# Patient Record
Sex: Male | Born: 1960 | Race: White | Hispanic: No | Marital: Single | Smoking: Current every day smoker
Health system: Southern US, Community
[De-identification: ages and names within clinical notes are randomized; demographics above are authoritative.]

## PROBLEM LIST (undated history)

## (undated) DIAGNOSIS — Z862 Personal history of diseases of the blood and blood-forming organs and certain disorders involving the immune mechanism: Secondary | ICD-10-CM

## (undated) DIAGNOSIS — I82409 Acute embolism and thrombosis of unspecified deep veins of unspecified lower extremity: Secondary | ICD-10-CM

## (undated) DIAGNOSIS — K259 Gastric ulcer, unspecified as acute or chronic, without hemorrhage or perforation: Secondary | ICD-10-CM

## (undated) HISTORY — PX: OTHER SURGICAL HISTORY: SHX169

---

## 2002-06-04 ENCOUNTER — Encounter: Payer: Self-pay | Admitting: Emergency Medicine

## 2002-06-04 ENCOUNTER — Emergency Department (HOSPITAL_COMMUNITY): Admission: EM | Admit: 2002-06-04 | Discharge: 2002-06-04 | Payer: Self-pay | Admitting: Emergency Medicine

## 2002-08-12 ENCOUNTER — Emergency Department (HOSPITAL_COMMUNITY): Admission: EM | Admit: 2002-08-12 | Discharge: 2002-08-12 | Payer: Self-pay | Admitting: Internal Medicine

## 2005-04-11 ENCOUNTER — Emergency Department: Payer: Self-pay | Admitting: Emergency Medicine

## 2014-07-06 ENCOUNTER — Inpatient Hospital Stay: Payer: Self-pay | Admitting: Surgery

## 2014-07-06 LAB — COMPREHENSIVE METABOLIC PANEL
Albumin: 3.2 g/dL — ABNORMAL LOW (ref 3.4–5.0)
Alkaline Phosphatase: 44 U/L — ABNORMAL LOW
Anion Gap: 13 (ref 7–16)
BUN: 11 mg/dL (ref 7–18)
Bilirubin,Total: 0.8 mg/dL (ref 0.2–1.0)
CHLORIDE: 99 mmol/L (ref 98–107)
CREATININE: 0.84 mg/dL (ref 0.60–1.30)
Calcium, Total: 8.4 mg/dL — ABNORMAL LOW (ref 8.5–10.1)
Co2: 22 mmol/L (ref 21–32)
EGFR (Non-African Amer.): >60
Glucose: 134 mg/dL — ABNORMAL HIGH (ref 65–99)
Osmolality: 270 (ref 275–301)
POTASSIUM: 4.3 mmol/L (ref 3.5–5.1)
SGOT(AST): 55 U/L — ABNORMAL HIGH (ref 15–37)
SGPT (ALT): 44 U/L
Sodium: 134 mmol/L — ABNORMAL LOW (ref 136–145)
Total Protein: 6.6 g/dL (ref 6.4–8.2)

## 2014-07-06 LAB — CBC
HCT: 45.5 % (ref 40.0–52.0)
HGB: 15.2 g/dL (ref 13.0–18.0)
MCH: 34.2 pg — AB (ref 26.0–34.0)
MCHC: 33.4 g/dL (ref 32.0–36.0)
MCV: 102 fL — ABNORMAL HIGH (ref 80–100)
PLATELETS: 158 10*3/uL (ref 150–440)
RBC: 4.45 10*6/uL (ref 4.40–5.90)
RDW: 14.1 % (ref 11.5–14.5)
WBC: 16.4 10*3/uL — ABNORMAL HIGH (ref 3.8–10.6)

## 2014-07-06 LAB — LIPASE, BLOOD: Lipase: 118 U/L (ref 73–393)

## 2014-07-07 LAB — BASIC METABOLIC PANEL
Anion Gap: 7 (ref 7–16)
Anion Gap: 2 — ABNORMAL LOW (ref 7–16)
BUN: 14 mg/dL (ref 7–18)
BUN: 12 mg/dL (ref 7–18)
CALCIUM: 7.1 mg/dL — AB (ref 8.5–10.1)
CALCIUM: 7.2 mg/dL — AB (ref 8.5–10.1)
Chloride: 104 mmol/L (ref 98–107)
Chloride: 104 mmol/L (ref 98–107)
Co2: 24 mmol/L (ref 21–32)
Co2: 29 mmol/L (ref 21–32)
Creatinine: 1.06 mg/dL (ref 0.60–1.30)
Creatinine: 1.13 mg/dL (ref 0.60–1.30)
EGFR (African American): >60
EGFR (Non-African Amer.): >60
EGFR (Non-African Amer.): >60
Glucose: 139 mg/dL — ABNORMAL HIGH (ref 65–99)
Glucose: 125 mg/dL — ABNORMAL HIGH (ref 65–99)
Osmolality: 273 (ref 275–301)
Osmolality: 271 (ref 275–301)
Potassium: 5.6 mmol/L — ABNORMAL HIGH (ref 3.5–5.1)
Potassium: 4.9 mmol/L (ref 3.5–5.1)
Sodium: 135 mmol/L — ABNORMAL LOW (ref 136–145)
Sodium: 135 mmol/L — ABNORMAL LOW (ref 136–145)

## 2014-07-07 LAB — URINALYSIS, COMPLETE
BACTERIA: NONE SEEN
Bilirubin,UR: NEGATIVE
Glucose,UR: NEGATIVE mg/dL (ref 0–75)
Ketone: NEGATIVE
LEUKOCYTE ESTERASE: NEGATIVE
Nitrite: NEGATIVE
PH: 6 (ref 4.5–8.0)
Protein: NEGATIVE
RBC,UR: 21 /HPF (ref 0–5)
SPECIFIC GRAVITY: 1.021 (ref 1.003–1.030)
Squamous Epithelial: NONE SEEN
WBC UR: 3 /HPF (ref 0–5)

## 2014-07-07 LAB — CBC WITH DIFFERENTIAL/PLATELET
BASOS PCT: 0.1 %
Basophil #: 0 10*3/uL (ref 0.0–0.1)
Eosinophil #: 0 10*3/uL (ref 0.0–0.7)
Eosinophil %: 0 %
HCT: 42.3 % (ref 40.0–52.0)
HGB: 13.9 g/dL (ref 13.0–18.0)
LYMPHS ABS: 1 10*3/uL (ref 1.0–3.6)
LYMPHS PCT: 10.2 %
MCH: 34.1 pg — ABNORMAL HIGH (ref 26.0–34.0)
MCHC: 32.8 g/dL (ref 32.0–36.0)
MCV: 104 fL — ABNORMAL HIGH (ref 80–100)
Monocyte #: 0.6 x10 3/mm (ref 0.2–1.0)
Monocyte %: 6.6 %
Neutrophil #: 8.1 10*3/uL — ABNORMAL HIGH (ref 1.4–6.5)
Neutrophil %: 83.1 %
PLATELETS: 135 10*3/uL — AB (ref 150–440)
RBC: 4.07 10*6/uL — ABNORMAL LOW (ref 4.40–5.90)
RDW: 14.2 % (ref 11.5–14.5)
WBC: 9.8 10*3/uL (ref 3.8–10.6)

## 2014-07-09 ENCOUNTER — Ambulatory Visit (HOSPITAL_COMMUNITY)
Admission: AD | Admit: 2014-07-09 | Discharge: 2014-07-09 | Disposition: A | Discharge status: Home / Self Care | Payer: Self-pay | Source: Intra-hospital | Attending: Surgical Oncology | Admitting: Surgical Oncology

## 2014-07-09 DIAGNOSIS — K631 Perforation of intestine (nontraumatic): Secondary | ICD-10-CM | POA: Insufficient documentation

## 2014-07-09 LAB — CBC WITH DIFFERENTIAL/PLATELET
Basophil #: 0 10*3/uL (ref 0.0–0.1)
Basophil %: 0.5 %
Eosinophil #: 0.1 10*3/uL (ref 0.0–0.7)
Eosinophil %: 1.2 %
HCT: 36.5 % — ABNORMAL LOW (ref 40.0–52.0)
HGB: 12.2 g/dL — ABNORMAL LOW (ref 13.0–18.0)
Lymphocyte #: 1.2 10*3/uL (ref 1.0–3.6)
Lymphocyte %: 18.7 %
MCH: 34.8 pg — ABNORMAL HIGH (ref 26.0–34.0)
MCHC: 33.6 g/dL (ref 32.0–36.0)
MCV: 104 fL — ABNORMAL HIGH (ref 80–100)
MONO ABS: 0.8 x10 3/mm (ref 0.2–1.0)
Monocyte %: 12 %
Neutrophil #: 4.2 10*3/uL (ref 1.4–6.5)
Neutrophil %: 67.6 %
PLATELETS: 107 10*3/uL — AB (ref 150–440)
RBC: 3.52 10*6/uL — ABNORMAL LOW (ref 4.40–5.90)
RDW: 14 % (ref 11.5–14.5)
WBC: 6.3 10*3/uL (ref 3.8–10.6)

## 2014-07-09 LAB — BASIC METABOLIC PANEL
Anion Gap: 5 — ABNORMAL LOW (ref 7–16)
BUN: 6 mg/dL — ABNORMAL LOW (ref 7–18)
CALCIUM: 7.7 mg/dL — AB (ref 8.5–10.1)
CO2: 30 mmol/L (ref 21–32)
CREATININE: 0.82 mg/dL (ref 0.60–1.30)
Chloride: 103 mmol/L (ref 98–107)
EGFR (African American): >60
Glucose: 98 mg/dL (ref 65–99)
Osmolality: 273 (ref 275–301)
POTASSIUM: 3.4 mmol/L — AB (ref 3.5–5.1)
Sodium: 138 mmol/L (ref 136–145)

## 2015-03-20 NOTE — H&P (Signed)
Subjective/Chief Complaint severe abdominal pain   History of Present Illness 54 y/o male with acute onset of severe unrelenting upper abdominal pain with radiation directly into his back. No preceeding abdominal pain, anorexia, weight loss, nausea or emesis Admits to heavy drinking during the last several weeks Smoke 1 ppd since age 42.   Past History chronic leg and foot pain.   Primary Physician [facility]   Past Med/Surgical Hx:  chronic pain: bilteral legs.  ITP:   none:   ALLERGIES:  No Known Allergies:    Other Allergies none   HOME MEDICATIONS: Medication Instructions Status  meloxicam 1 tab(s) orally once a day (in the morning) Active  acetaminophen-oxyCODONE 325 mg-5 mg oral tablet 1 tab(s) orally , As Needed Active   Family and Social History:  Family History Non-Contributory   Social History positive  tobacco, positive ETOH, negative Illicit drugs   + Tobacco Current (within 1 year)   Place of Living Home   Review of Systems:  Subjective/Chief Complaint see above.   Abdominal Pain Yes   Tolerating Diet No   Medications/Allergies Reviewed Medications/Allergies reviewed   Physical Exam:  GEN disheveled, critically ill appearing, 9+7.7 p 81 rr 16, BP 114/79   HEENT pale conjunctivae, PERRL   NECK supple   RESP normal resp effort  clear BS   CARD regular rate   ABD positive tenderness  signifiacnt diffuse peritonitis present   LYMPH negative neck   EXTR negative cyanosis/clubbing   NEURO cranial nerves intact   PSYCH A+O to time, place, person   Lab Results: Hepatic:  10-Aug-15 18:24   Bilirubin, Total 0.8  Alkaline Phosphatase  44 (46-116 NOTE: New Reference Range 06/16/14)  SGPT (ALT) 44 (14-63 NOTE: New Reference Range 06/16/14)  SGOT (AST)  55  Total Protein, Serum 6.6  Albumin, Serum  3.2  Routine Chem:  10-Aug-15 18:24   Lipase 118 (Result(s) reported on 06 Jul 2014 at 06:55PM.)  Glucose, Serum  134  BUN 11   Creatinine (comp) 0.84  Sodium, Serum  134  Potassium, Serum 4.3  Chloride, Serum 99  CO2, Serum 22  Calcium (Total), Serum  8.4  Osmolality (calc) 270  eGFR (African American) >60  eGFR (Non-African American) >60 (eGFR values <54m/min/1.73 m2 may be an indication of chronic kidney disease (CKD). Calculated eGFR is useful in patients with stable renal function. The eGFR calculation will not be reliable in acutely ill patients when serum creatinine is changing rapidly. It is not useful in  patients on dialysis. The eGFR calculation may not be applicable to patients at the low and high extremes of body sizes, pregnant women, and vegetarians.)  Anion Gap 13  Routine Hem:  10-Aug-15 18:24   WBC (CBC)  16.4  RBC (CBC) 4.45  Hemoglobin (CBC) 15.2  Hematocrit (CBC) 45.5  Platelet Count (CBC) 158 (Result(s) reported on 06 Jul 2014 at 06:53PM.)  MCV  102  MCH  34.2  MCHC 33.4  RDW 14.1    Assessment/Admission Diagnosis 54y/o with acute perforated DU and retro-peritoneal extravasation, large hole seen on CT corresponds to his clinical exam.   Plan NPO, IVF, IV invanz, urgent exploratory laparotomy with GPhillip Healpatch vs resection based on operative findings. Discussed serious nature of problem with patient and all his questions addressed. EKG shows NSR and portable cxr pending at this time.   Electronic Signatures: BSherri Rad(MD)  (Signed 10-Aug-15 20:11)  Authored: CHIEF COMPLAINT and HISTORY, PAST MEDICAL/SURGIAL HISTORY, ALLERGIES, Other Allergies, HOME  MEDICATIONS, FAMILY AND SOCIAL HISTORY, REVIEW OF SYSTEMS, PHYSICAL EXAM, LABS, ASSESSMENT AND PLAN   Last Updated: 10-Aug-15 20:11 by Sherri Rad (MD)

## 2015-03-20 NOTE — Discharge Summary (Signed)
PATIENT NAME:  Ricky Ray, Ricky Ray A MR#:  [id number] DATE OF BIRTH:  12/08/1960  DATE OF ADMISSION:  07/06/2014 DATE OF DISCHARGE:  07/09/2014   FINAL DIAGNOSIS: Perforated pyloric ulcer with retroperitoneal perforation.   PROCEDURE PERFORMED:  1.  CAT scan of the abdomen and pelvis.  2.  Exploratory laparotomy.  3.  Pyloroplasty.  4.  Modified Graham patch closure of pyloric ulcer.  5.  Peritoneal lavage.  6.  Placement of drains.   HOSPITAL COURSE: The patient was taken directly to the operating room, with a preoperative diagnosis of a posterior perforation of a duodenal ulcer. Intraoperative findings demonstrated this to be pyloric channel ulcer, which was repaired as described above. Postoperatively, the patient had no signs of alcohol withdrawal. He was started on a CIWA protocol. He maintained n.p.o. status with a nasogastric tube. His drains were somewhat bilious on postoperative day #1, but by  postoperative day #2, they were serosanguineous. The patient has had no signs of distress. He remained hemodynamically stable. His laboratory values remained stable. He did have a slight thrombocytopenia. Heparin was discontinued on postoperative day #2 and sequential compression devices were continued. His Foley catheter was removed on postoperative day #2.  SCDs were continued.   He is tentatively transferred to the [place] on 07/09/2014.   RECOMMENDATIONS: Will keep the NG tube for another 3 days, and JPs in place until the patient is on a regular diet, tolerating it well with no evidence of bile output. We will follow up the patient in our office as needed.   DISCHARGE MEDICATIONS:  1.  Protonix IV 40 mg b.i.d.  2.  Invanz 1 g IV q. day.  3.  Morphine PCA.  4.  Ativan.  5.  Haldol.  6.  Nitroglycerine as needed.  7.  Zofran as needed.    ____________________________ [name], MD mab:MT D: 07/09/2014 09:52:15 ET T: 07/09/2014 10:10:02 ET JOB#: [id number]  cc: Elta Guadel[place] A. Marina Gravel,  MD, <Dictator> Hortencia Conradi MD ELECTRONICALLY SIGNED 07/09/2014 19:53

## 2015-03-20 NOTE — Op Note (Signed)
PATIENT NAME:  Ricky Ray, Ricky Ray MR#:  [id number] DATE OF BIRTH:  [birth year]  DATE OF PROCEDURE:  07/06/2014  PREOPERATIVE DIAGNOSIS: Posterior perforated duodenal ulcer into retroperitoneum.  POSTOPERATIVE DIAGNOSIS: Perforated pyloric channel ulcer with gross contamination of the retroperitoneum and abdominal cavity with bile peritonitis.   FINDINGS:  The defect was 2 cm located anteriorly and slightly posteriorly   PROCEDURES PERFORMED:  1.  Exploratory laparotomy.  2.  Pyloroplasty with closure of the ulcer,  3.  Modified Graham patch application. 4.  Peritoneal lavage.   SURGEON: Sherri Rad, Ray FACS  ASSISTANT: Scrub tech.  ANESTHESIA: General endotracheal.  ESTIMATED BLOOD LOSS: 100 mL.   DRAINS: Two Jackson-Pratt's in the right upper quadrant, 1 in the retroperitoneum, 1 in Morison's pouch.   DESCRIPTION OF PROCEDURE: With informed consent, supine position and general oral endotracheal anesthesia, the patient's abdomen was widely prepped and draped with ChloraPrep solution following clipping of hair with electric clipper. Timeout was observed.   Ray midline incision was fashioned from the xiphoid to the just below the umbilicus with scalpel and electrocautery through musculofascial layers. Ray self-retaining abdominal retractor was placed. Immediately, Ray large amount of bilio-purulent material and fluid was evacuated. The abdomen was copiously irrigated with several liters of warm normal saline. Nasogastric tube was passed by anesthesia and confirmed in the antrum of the stomach by palpation and secured.   The perforation had occurred in Ray retroperitoneal fashion and this had actually dissected out the duodenum and Ray Kocher maneuver was not necessary. Ray few strands of attachments were divided with the LigaSure apparatus. The entire 2nd and 3rd portions of duodenum were inspected and I could find no evidence of Ray posterior perforation in these portions.   In the area of the pyloric channel  was Ray 2 cm round opening with no active hemorrhage. I probed this with my 5th digit, demonstrating no evidence of perforation posteriorly. Given that it was in the area of the pylorus and there was some scarring, the pylorus was opened towards the stomach for 1.5 cm with electrocautery and then Ray transverse closure was achieved in 2 layers with Ray running locking 3-0 PDS in the mucosa and multiple seromuscular 3-0 silk sutures.   Ray tongue of omentum was then liberated off the transverse colon with electrocautery and the LigaSure apparatus and then secured to the repair utilizing multiple interrupted 3-0 silk sutures.   At this point, the abdomen was again irrigated with several liters of warm normal saline. Two 19 mm Blake drains were directed into the retroperitoneal space as described above and exited the right upper quadrant, securing the exit sites with 3-0 nylon suture.   With lap and needle count correct x 2 and hemostasis being ensured on the operative field, the area over the liver was copiously irrigated and aspirated dry. The abdominal midline fascia was closed with running looped #1 PDS from the extremes of the wound with interrupted #1 Vicryls as internal retention sutures.   Skin edges were reapproximated after irrigation with skin stapler. Sterile dressing was applied. The patient was subsequently extubated and taken to the recovery room in stable and satisfactory condition by anesthesia services.     ____________________________ [name], Ray FACS  mab:TT D: 07/07/2014 20:56:34 ET T: 07/07/2014 21:21:55 ET JOB#: [id number]  cc: Ricky Ray. Marina Gravel, Ray, <Dictator> Ricky Ray Ricky Ray ELECTRONICALLY SIGNED 07/08/2014 20:21

## 2015-06-28 IMAGING — CR DG CHEST 1V PORT
1 series · 1 of 1 positions shown · non-contrast
Comparison: No priors.

CLINICAL DATA: Preoperative evaluation.

EXAM:
PORTABLE CHEST - 1 VIEW

[ap]
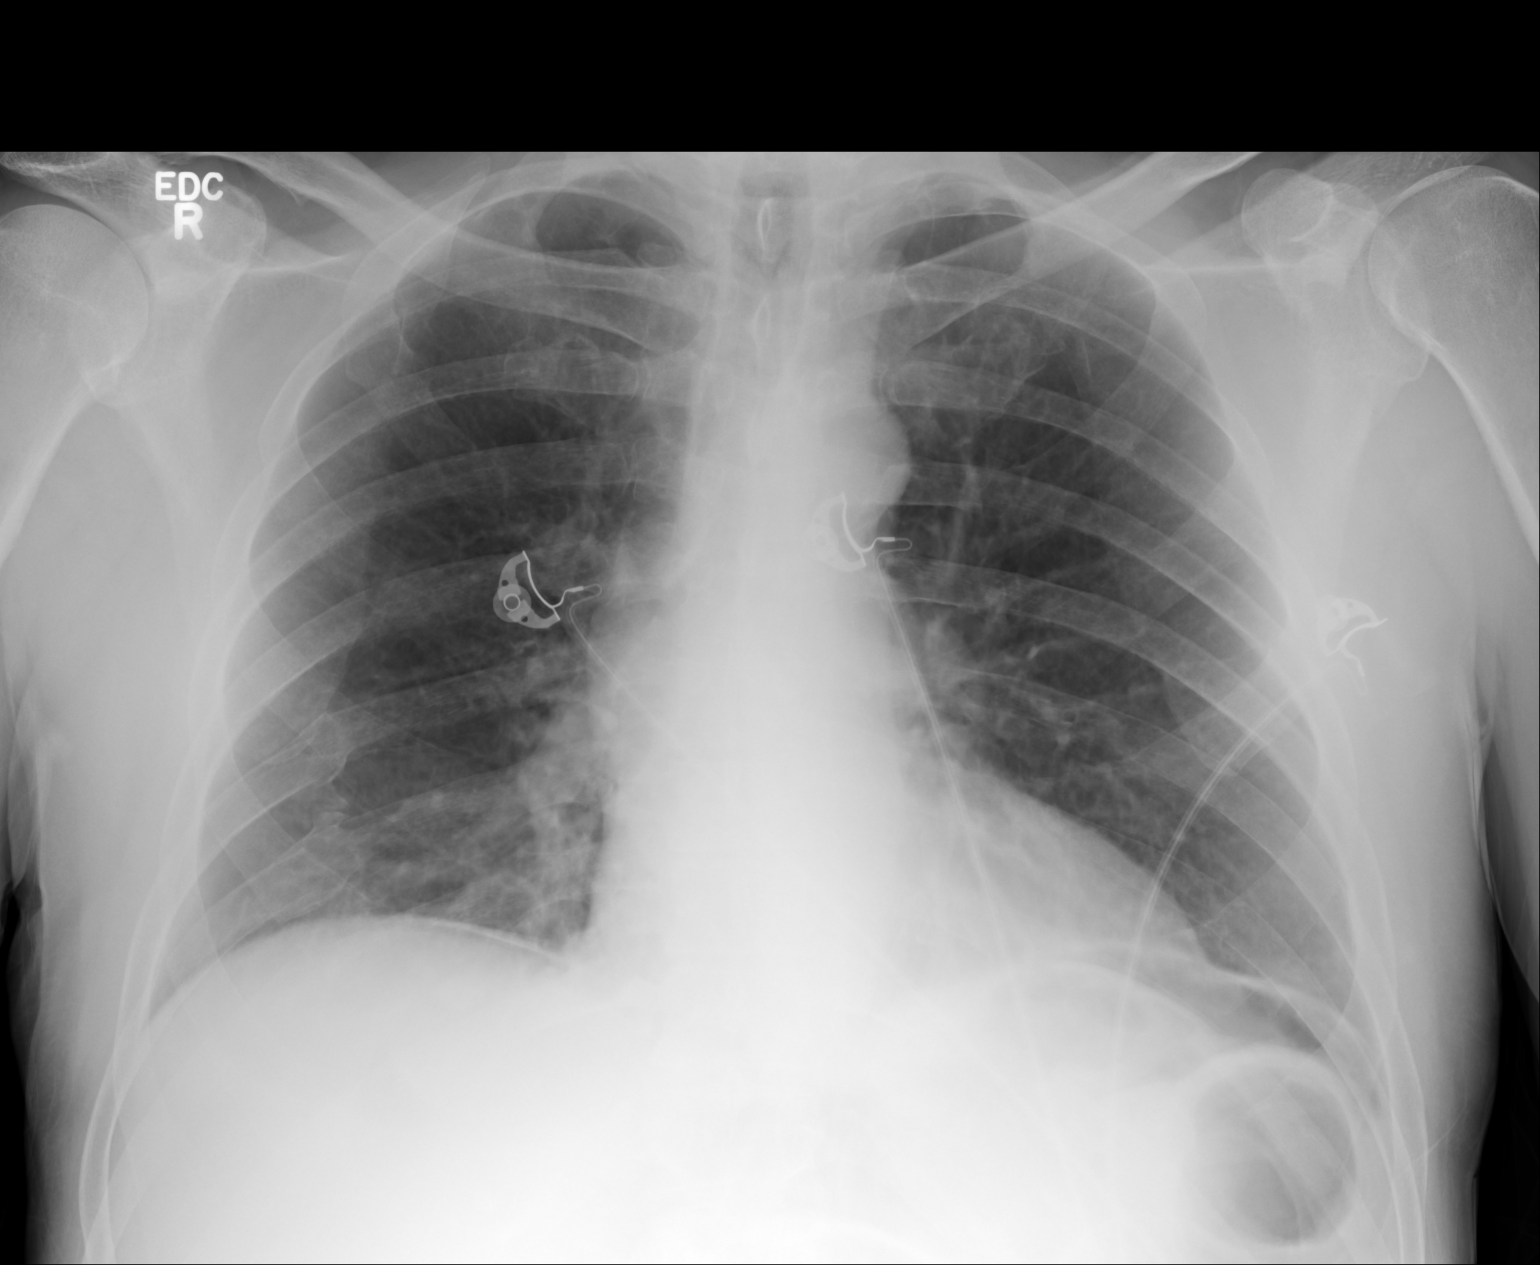

[1 of 1 positions shown; findings below may reference images not displayed]

FINDINGS: Lung volumes are low. No consolidative airspace disease. No pleural
effusions. No pneumothorax. No pulmonary nodule or mass noted.
Pulmonary vasculature and the cardiomediastinal silhouette are
within normal limits. Old healed fractures of the posterior aspect
of the right seventh and eighth ribs. Pneumoperitoneum noted
underneath both of the hemidiaphragms.
IMPRESSION: 1. Pneumoperitoneum.
2. Low lung volumes without radiographic evidence of acute
cardiopulmonary disease.

## 2015-07-14 ENCOUNTER — Emergency Department
Admission: EM | Admit: 2015-07-14 | Discharge: 2015-07-14 | Disposition: A | Discharge status: Home / Self Care | Payer: Non-veteran care | Attending: Emergency Medicine | Admitting: Emergency Medicine

## 2015-07-14 ENCOUNTER — Emergency Department: Payer: Non-veteran care

## 2015-07-14 ENCOUNTER — Encounter: Payer: Self-pay | Admitting: Emergency Medicine

## 2015-07-14 DIAGNOSIS — Z720 Tobacco use: Secondary | ICD-10-CM | POA: Insufficient documentation

## 2015-07-14 DIAGNOSIS — N453 Epididymo-orchitis: Secondary | ICD-10-CM

## 2015-07-14 DIAGNOSIS — N50819 Testicular pain, unspecified: Secondary | ICD-10-CM

## 2015-07-14 DIAGNOSIS — L03818 Cellulitis of other sites: Secondary | ICD-10-CM

## 2015-07-14 DIAGNOSIS — N454 Abscess of epididymis or testis: Secondary | ICD-10-CM | POA: Insufficient documentation

## 2015-07-14 HISTORY — DX: Gastric ulcer, unspecified as acute or chronic, without hemorrhage or perforation: K25.9

## 2015-07-14 LAB — COMPREHENSIVE METABOLIC PANEL
ALBUMIN: 3.3 g/dL — AB (ref 3.5–5.0)
ALK PHOS: 48 U/L (ref 38–126)
ALT: 35 U/L (ref 17–63)
ANION GAP: 9 (ref 5–15)
AST: 29 U/L (ref 15–41)
BILIRUBIN TOTAL: 0.8 mg/dL (ref 0.3–1.2)
CALCIUM: 8.6 mg/dL — AB (ref 8.9–10.3)
CO2: 25 mmol/L (ref 22–32)
CREATININE: 0.7 mg/dL (ref 0.61–1.24)
Chloride: 100 mmol/L — ABNORMAL LOW (ref 101–111)
GFR calc Af Amer: >60 mL/min (ref >60)
GFR calc non Af Amer: >60 mL/min (ref >60)
GLUCOSE: 118 mg/dL — AB (ref 65–99)
Potassium: 4.1 mmol/L (ref 3.5–5.1)
Sodium: 134 mmol/L — ABNORMAL LOW (ref 135–145)
TOTAL PROTEIN: 6.9 g/dL (ref 6.5–8.1)

## 2015-07-14 LAB — CBC WITH DIFFERENTIAL/PLATELET
BASOS PCT: 1 %
Basophils Absolute: 0.1 10*3/uL (ref 0–0.1)
Eosinophils Absolute: 0 10*3/uL (ref 0–0.7)
Eosinophils Relative: 0 %
HEMATOCRIT: 41.3 % (ref 40.0–52.0)
HEMOGLOBIN: 13.8 g/dL (ref 13.0–18.0)
LYMPHS ABS: 0.9 10*3/uL — AB (ref 1.0–3.6)
Lymphocytes Relative: 8 %
MCH: 35 pg — ABNORMAL HIGH (ref 26.0–34.0)
MCHC: 33.3 g/dL (ref 32.0–36.0)
MCV: 105 fL — ABNORMAL HIGH (ref 80.0–100.0)
MONOS PCT: 11 %
Monocytes Absolute: 1.2 10*3/uL — ABNORMAL HIGH (ref 0.2–1.0)
NEUTROS ABS: 9.1 10*3/uL — AB (ref 1.4–6.5)
NEUTROS PCT: 80 %
Platelets: 203 10*3/uL (ref 150–440)
RBC: 3.94 MIL/uL — AB (ref 4.40–5.90)
RDW: 14.1 % (ref 11.5–14.5)
WBC: 11.3 10*3/uL — ABNORMAL HIGH (ref 3.8–10.6)

## 2015-07-14 LAB — URINALYSIS COMPLETE WITH MICROSCOPIC (ARMC ONLY)
BILIRUBIN URINE: NEGATIVE
GLUCOSE, UA: NEGATIVE mg/dL
Ketones, ur: NEGATIVE mg/dL
Nitrite: POSITIVE — AB
Protein, ur: NEGATIVE mg/dL
Specific Gravity, Urine: 1.013 (ref 1.005–1.030)
pH: 6 (ref 5.0–8.0)

## 2015-07-14 MED ORDER — CEPHALEXIN 500 MG PO CAPS
500.0000 mg | ORAL_CAPSULE | Freq: Once | ORAL | Status: AC
  Administered 2015-07-14: 500 mg via ORAL
  Filled 2015-07-14: qty 1

## 2015-07-14 MED ORDER — OXYCODONE-ACETAMINOPHEN 5-325 MG PO TABS
ORAL_TABLET | ORAL | Status: AC
  Filled 2015-07-14: qty 1

## 2015-07-14 MED ORDER — LEVOFLOXACIN 500 MG PO TABS
500.0000 mg | ORAL_TABLET | Freq: Once | ORAL | Status: AC
  Administered 2015-07-14: 500 mg via ORAL
  Filled 2015-07-14: qty 1

## 2015-07-14 MED ORDER — TRAMADOL HCL 50 MG PO TABS: 50.0000 mg | ORAL_TABLET | Freq: Four times a day (QID) | ORAL | Status: AC | PRN

## 2015-07-14 MED ORDER — OXYCODONE-ACETAMINOPHEN 5-325 MG PO TABS
1.0000 | ORAL_TABLET | Freq: Once | ORAL | Status: AC
  Administered 2015-07-14: 1 via ORAL
  Filled 2015-07-14: qty 1

## 2015-07-14 MED ORDER — LEVOFLOXACIN 500 MG PO TABS: 500.0000 mg | ORAL_TABLET | Freq: Every day | ORAL | Status: AC

## 2015-07-14 MED ORDER — OXYCODONE-ACETAMINOPHEN 5-325 MG PO TABS
1.0000 | ORAL_TABLET | Freq: Once | ORAL | Status: AC
  Administered 2015-07-14: 1 via ORAL

## 2015-07-14 MED ORDER — CEPHALEXIN 500 MG PO CAPS: 500.0000 mg | ORAL_CAPSULE | Freq: Four times a day (QID) | ORAL | Status: AC

## 2015-07-14 NOTE — ED Provider Notes (Signed)
[facility] Emergency Department Provider Note  ____________________________________________  Time seen: Approximately 597 PM  I have reviewed the triage vital signs and the nursing notes.   HISTORY  Chief Complaint Testicle Pain    HPI Ricky Ray is a 54 y.o. male with a history of a gastric ulcer who presents today with right-sided swelling and pain in his testicle. He says that he felt a lump in that testicle. He denies any burning with urination. Denies any concern for sexually transmitted diseases. Has never had anything like this happen to him in the past. Denies any fever. Denies any abdominal pain nausea or vomiting. Denies any discharge from the penis.   Past Medical History  Diagnosis Date  . Gastric ulcer     There are no active problems to display for this patient.   History reviewed. No pertinent past surgical history.  No current outpatient prescriptions on file.  Allergies Review of patient's allergies indicates no known allergies.  History reviewed. No pertinent family history.  Social History Social History  Substance Use Topics  . Smoking status: Current Every Day Smoker  . Smokeless tobacco: None  . Alcohol Use: Yes    Review of Systems Constitutional: No fever/chills Eyes: No visual changes. ENT: No sore throat. Cardiovascular: Denies chest pain. Respiratory: Denies shortness of breath. Gastrointestinal: No abdominal pain.  No nausea, no vomiting.  No diarrhea.  No constipation. Genitourinary: Negative for dysuria. Musculoskeletal: Negative for back pain. Skin: Negative for rash. Neurological: Negative for headaches, focal weakness or numbness.  10-point ROS otherwise negative.  ____________________________________________   PHYSICAL EXAM:  VITAL SIGNS: ED Triage Vitals  Enc Vitals Group     BP 07/14/15 1714 113/83 mmHg     Pulse Rate 07/14/15 1714 89     Resp 07/14/15 1714 20     Temp 07/14/15 1714  98.9 F (37.2 C)     Temp Source 07/14/15 1714 Oral     SpO2 07/14/15 1714 98 %     Weight 07/14/15 1714 160 lb (72.576 kg)     Height 07/14/15 1714 5\' 8"  (1.727 m)     Head Cir --      Peak Flow --      Pain Score 07/14/15 1715 10     Pain Loc --      Pain Edu? --      Excl. in [place]? --     Constitutional: Alert and oriented. Disheveled and in no acute distress. Eyes: Conjunctivae are normal. PERRL. EOMI. Head: Atraumatic. Nose: No congestion/rhinnorhea. Mouth/Throat: Mucous membranes are moist.  Oropharynx non-erythematous. Neck: No stridor.   Cardiovascular: Normal rate, regular rhythm. Grossly normal heart sounds.  Good peripheral circulation. Respiratory: Normal respiratory effort.  No retractions. Lungs CTAB. Gastrointestinal: Soft and nontender. No distention. No abdominal bruits. No CVA tenderness. Genitourinary: Scrotal erythema to the right hemiscrotum with mild crossover to the left hemiscrotum. No pus or induration. Right testicle palpated and is tender diffusely. No distinct masses palpated.  No fluctuance. No hernia sacs visualized or palpated in the inguinal region. Musculoskeletal: No lower extremity tenderness nor edema.  No joint effusions. Neurologic:  Normal speech and language. No gross focal neurologic deficits are appreciated. No gait instability. Skin:  Skin is warm, dry and intact. No rash noted. Psychiatric: Mood and affect are normal. Speech and behavior are normal.  ____________________________________________   LABS (all labs ordered are listed, but only abnormal results are displayed)  Labs Reviewed  URINALYSIS COMPLETEWITH MICROSCOPIC [facility]  ONLY) - Abnormal; Notable for the following:    Color, Urine YELLOW (*)    APPearance CLEAR (*)    Hgb urine dipstick 1+ (*)    Nitrite POSITIVE (*)    Leukocytes, UA 2+ (*)    Bacteria, UA FEW (*)    Squamous Epithelial / LPF 0-5 (*)    All other components within normal limits  CBC WITH  DIFFERENTIAL/PLATELET - Abnormal; Notable for the following:    WBC 11.3 (*)    RBC 3.94 (*)    MCV 105.0 (*)    MCH 35.0 (*)    Neutro Abs 9.1 (*)    Lymphs Abs 0.9 (*)    Monocytes Absolute 1.2 (*)    All other components within normal limits  COMPREHENSIVE METABOLIC PANEL - Abnormal; Notable for the following:    Sodium 134 (*)    Chloride 100 (*)    Glucose, Bld 118 (*)    BUN <5 (*)    Calcium 8.6 (*)    Albumin 3.3 (*)    All other components within normal limits  URINE CULTURE   ____________________________________________  EKG   ____________________________________________  RADIOLOGY  Ultrasound of the scrotum with right epididymoorchitis. ____________________________________________   PROCEDURES    ____________________________________________   INITIAL IMPRESSION / ASSESSMENT AND PLAN / ED COURSE  Pertinent labs & imaging results that were available during my care of the patient were reviewed by me and considered in my medical decision making (see chart for details).  ----------------------------------------- 9:59 PM on 07/14/2015 -----------------------------------------  Patient resting comfortably at this time. Reviewed labs as well as imaging results with the patient. Says that he is sexually active, infrequently, with one partner. No history of sexually transmitted diseases. Also with nitrite positive urine. Feel that the epididymoorchitis likely sequela of a urinary tract infection. Likely Escherichia coli secondary to nitrite positive. We'll treat with Levaquin as well as Keflex to cover skin flora for the erythematous scrotum. Patient is seen at the [facility] and will be able to follow-up with his primary care doctor within 1 week. We'll discharge to home. ____________________________________________   FINAL CLINICAL IMPRESSION(S) / ED DIAGNOSES  Acute epididymoorchitis. Acute urinary tract infection. Initial visit.    Orbie Pyo,  MD 07/14/15 2200

## 2015-07-14 NOTE — ED Notes (Signed)
Reports woke up this am with painful swollen right testicle.

## 2015-07-14 NOTE — ED Notes (Signed)
States has abcess on testicle

## 2015-07-14 NOTE — ED Notes (Signed)
Patient transported to Ultrasound 

## 2015-07-16 LAB — URINE CULTURE

## 2015-07-27 ENCOUNTER — Emergency Department
Admission: EM | Admit: 2015-07-27 | Discharge: 2015-07-27 | Disposition: A | Discharge status: Home / Self Care | Payer: Non-veteran care

## 2016-09-08 IMAGING — US US SCROTUM
1 series · 14 of 25 positions shown · non-contrast
Comparison: None.

CLINICAL DATA: Redness and swelling of the right testicle for 2
days.

EXAM:
SCROTAL ULTRASOUND
DOPPLER ULTRASOUND OF THE TESTICLES
TECHNIQUE: Complete ultrasound examination of the testicles, epididymis, and
other scrotal structures was performed. Color and spectral Doppler
ultrasound were also utilized to evaluate blood flow to the
testicles.

[Series 1: us scrotum · 0.06mm/px · 14 of 83 slices shown]
[im 1/83]
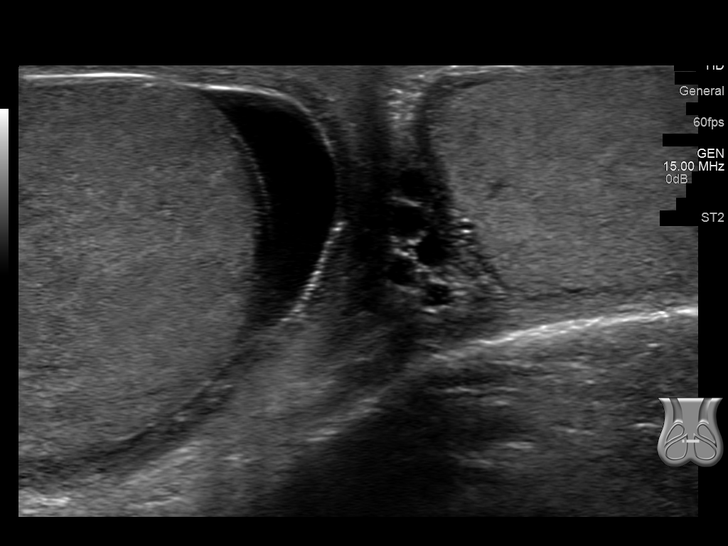
[im 7/83]
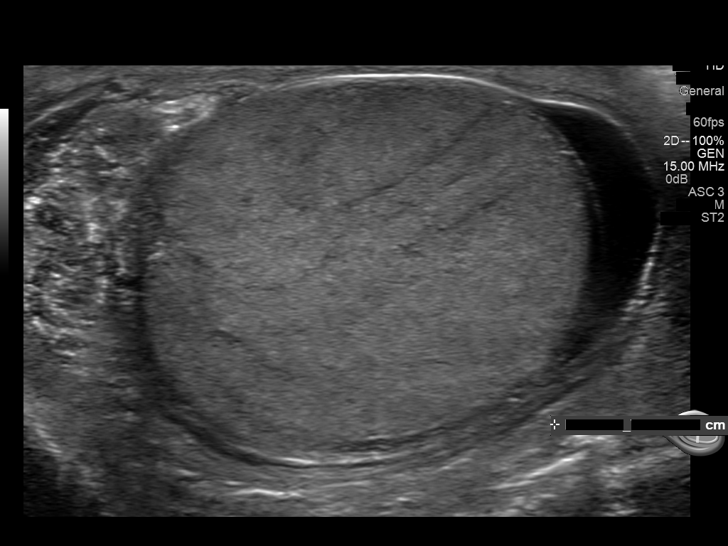
[im 14/83]
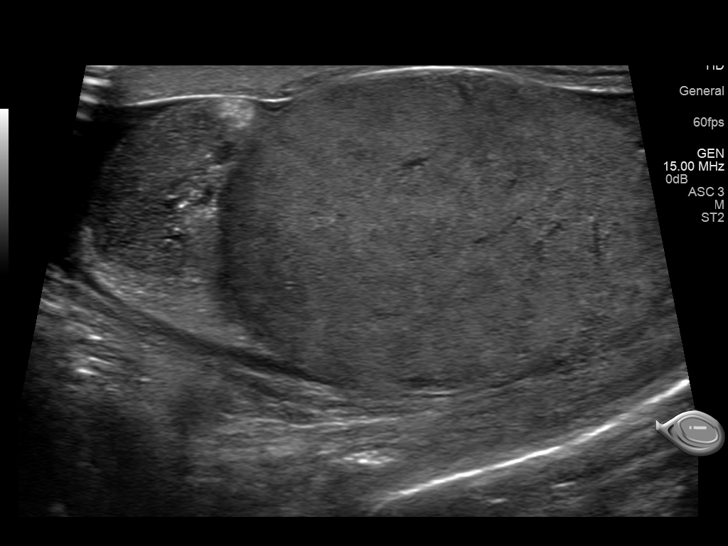
[im 21/83]
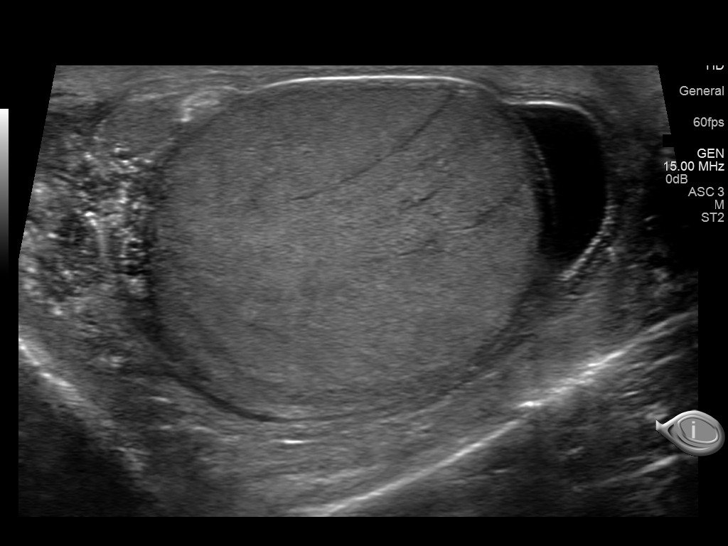
[im 28/83]
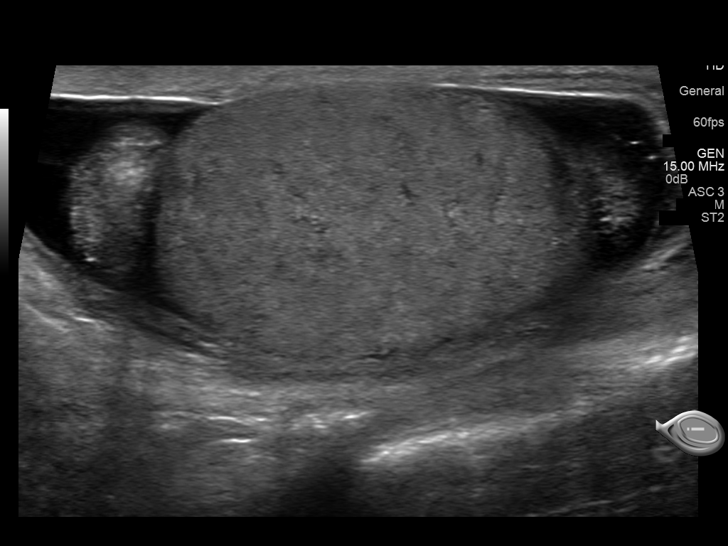
[im 31/83]
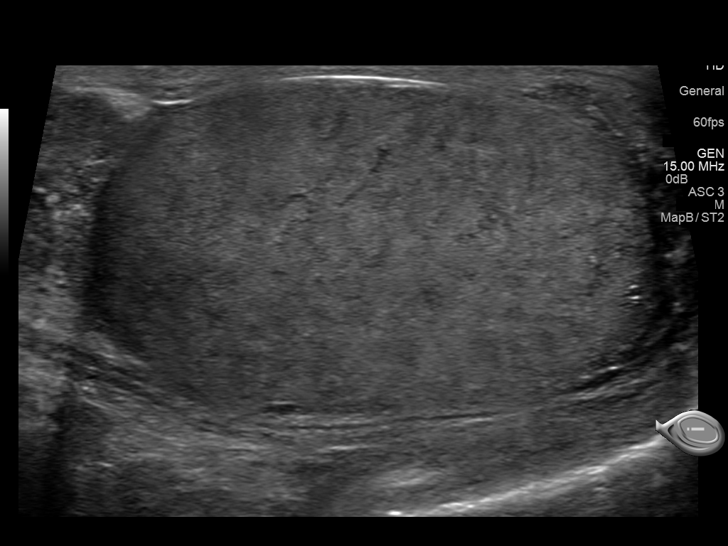
[im 38/83]
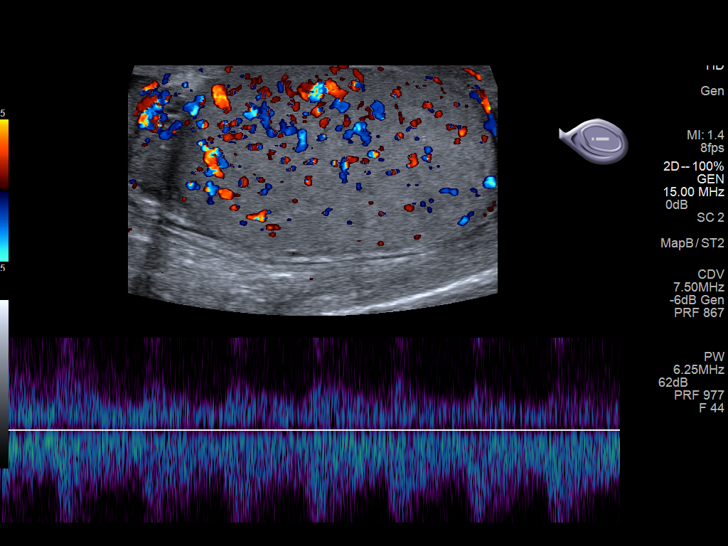
[im 45/83]
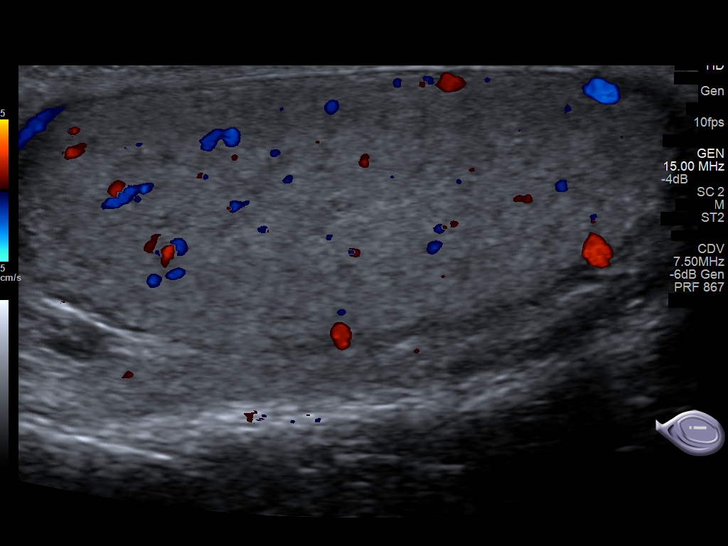
[im 52/83]
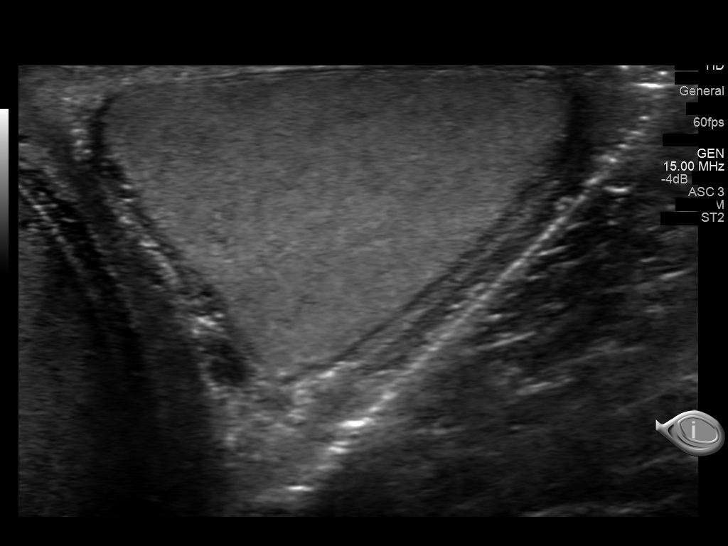
[im 55/83]
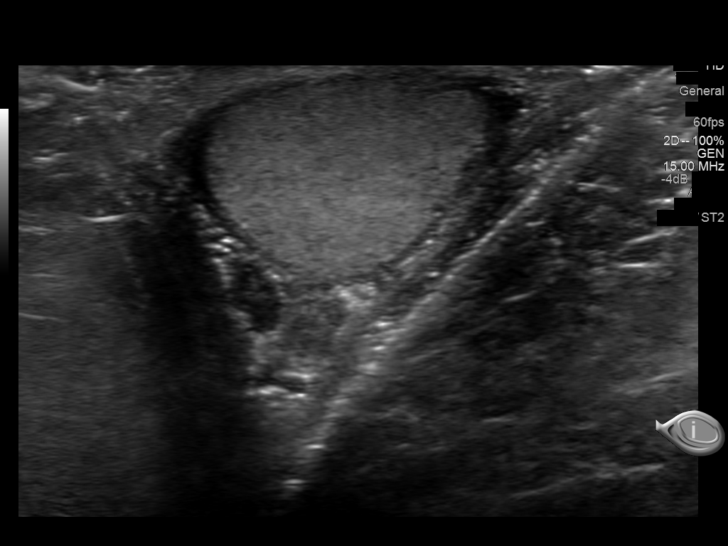
[im 62/83]
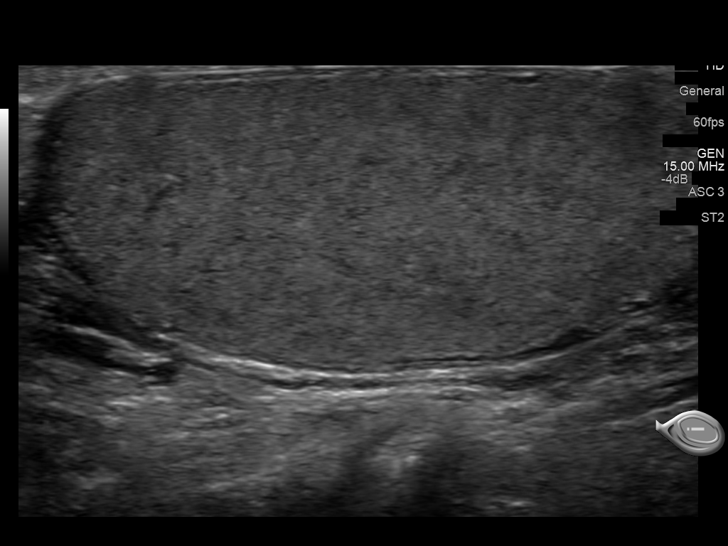
[im 69/83]
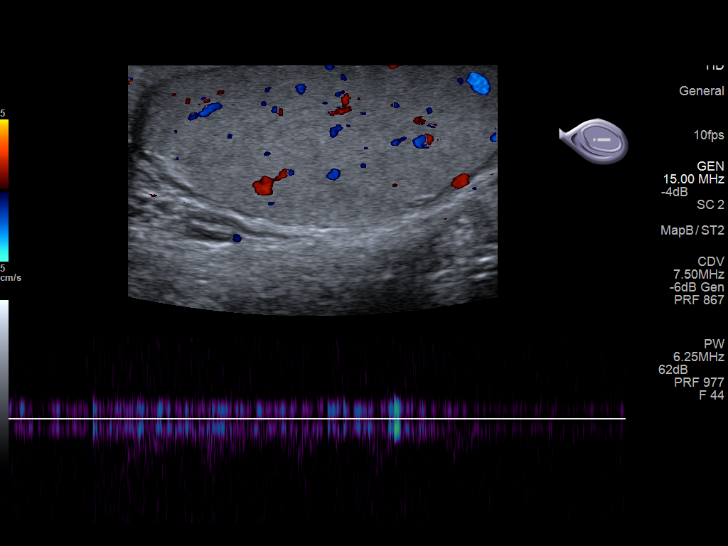
[im 76/83]
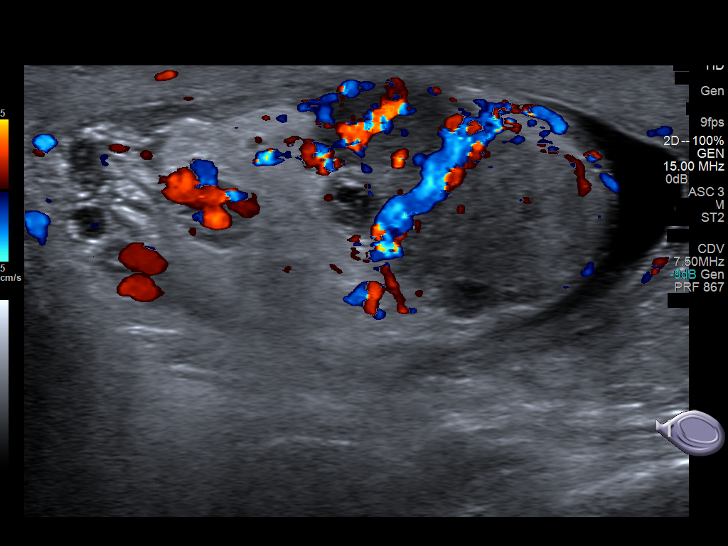
[im 83/83]
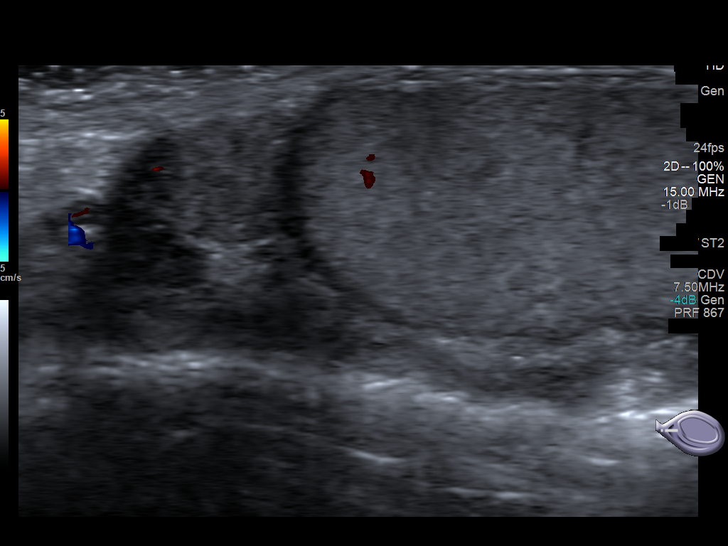

[14 of 25 positions shown; findings below may reference images not displayed]

FINDINGS: Right testicle

Measurements: 5.4 x 3.3 x 3.9 cm. The testicle is slightly enlarged
compared to the left testicle. There is hypervascularity of the
testicle. No mass or microlithiasis visualized.

Left testicle

Measurements: 4.9 x 2.1 x 2.8 cm. No mass or microlithiasis
visualized.

Right epididymis: Diffuse enlargement of the right epididymis with
increased vascularity.

Left epididymis:  Normal in size and appearance.

Hydrocele:  Small right hydrocele.

Varicocele:  Small bilateral varicoceles.

Pulsed Doppler interrogation of both testes demonstrates normal low
resistance arterial and venous waveforms bilaterally.
IMPRESSION: Right epididymo-orchitis.

## 2019-02-15 ENCOUNTER — Inpatient Hospital Stay
Admission: EM | Admit: 2019-02-15 | Discharge: 2019-02-28 | DRG: 291 | Disposition: A | Discharge status: Home Health | Payer: No Typology Code available for payment source | Attending: Family Medicine | Admitting: Family Medicine

## 2019-02-15 ENCOUNTER — Emergency Department: Payer: No Typology Code available for payment source

## 2019-02-15 ENCOUNTER — Other Ambulatory Visit: Payer: Self-pay

## 2019-02-15 DIAGNOSIS — R001 Bradycardia, unspecified: Secondary | ICD-10-CM | POA: Diagnosis not present

## 2019-02-15 DIAGNOSIS — R002 Palpitations: Secondary | ICD-10-CM | POA: Diagnosis present

## 2019-02-15 DIAGNOSIS — J9601 Acute respiratory failure with hypoxia: Secondary | ICD-10-CM | POA: Diagnosis present

## 2019-02-15 DIAGNOSIS — M7989 Other specified soft tissue disorders: Secondary | ICD-10-CM | POA: Diagnosis present

## 2019-02-15 DIAGNOSIS — R000 Tachycardia, unspecified: Secondary | ICD-10-CM | POA: Diagnosis present

## 2019-02-15 DIAGNOSIS — J9801 Acute bronchospasm: Secondary | ICD-10-CM | POA: Diagnosis present

## 2019-02-15 DIAGNOSIS — Z8711 Personal history of peptic ulcer disease: Secondary | ICD-10-CM

## 2019-02-15 DIAGNOSIS — R188 Other ascites: Secondary | ICD-10-CM | POA: Diagnosis present

## 2019-02-15 DIAGNOSIS — I4892 Unspecified atrial flutter: Secondary | ICD-10-CM | POA: Diagnosis present

## 2019-02-15 DIAGNOSIS — N390 Urinary tract infection, site not specified: Secondary | ICD-10-CM | POA: Diagnosis present

## 2019-02-15 DIAGNOSIS — I5023 Acute on chronic systolic (congestive) heart failure: Secondary | ICD-10-CM | POA: Diagnosis present

## 2019-02-15 DIAGNOSIS — I5021 Acute systolic (congestive) heart failure: Secondary | ICD-10-CM

## 2019-02-15 DIAGNOSIS — Z7189 Other specified counseling: Secondary | ICD-10-CM

## 2019-02-15 DIAGNOSIS — I513 Intracardiac thrombosis, not elsewhere classified: Secondary | ICD-10-CM | POA: Diagnosis present

## 2019-02-15 DIAGNOSIS — F1010 Alcohol abuse, uncomplicated: Secondary | ICD-10-CM | POA: Diagnosis present

## 2019-02-15 DIAGNOSIS — D151 Benign neoplasm of heart: Secondary | ICD-10-CM | POA: Diagnosis present

## 2019-02-15 DIAGNOSIS — B952 Enterococcus as the cause of diseases classified elsewhere: Secondary | ICD-10-CM | POA: Diagnosis present

## 2019-02-15 DIAGNOSIS — R05 Cough: Secondary | ICD-10-CM | POA: Diagnosis present

## 2019-02-15 DIAGNOSIS — I429 Cardiomyopathy, unspecified: Secondary | ICD-10-CM | POA: Diagnosis present

## 2019-02-15 DIAGNOSIS — I110 Hypertensive heart disease with heart failure: Secondary | ICD-10-CM | POA: Diagnosis present

## 2019-02-15 DIAGNOSIS — I959 Hypotension, unspecified: Secondary | ICD-10-CM | POA: Diagnosis not present

## 2019-02-15 DIAGNOSIS — Z515 Encounter for palliative care: Secondary | ICD-10-CM | POA: Diagnosis present

## 2019-02-15 DIAGNOSIS — Z86718 Personal history of other venous thrombosis and embolism: Secondary | ICD-10-CM

## 2019-02-15 DIAGNOSIS — Z66 Do not resuscitate: Secondary | ICD-10-CM | POA: Diagnosis present

## 2019-02-15 DIAGNOSIS — E876 Hypokalemia: Secondary | ICD-10-CM | POA: Diagnosis not present

## 2019-02-15 DIAGNOSIS — R140 Abdominal distension (gaseous): Secondary | ICD-10-CM | POA: Diagnosis present

## 2019-02-15 DIAGNOSIS — I4891 Unspecified atrial fibrillation: Secondary | ICD-10-CM | POA: Diagnosis present

## 2019-02-15 DIAGNOSIS — I509 Heart failure, unspecified: Secondary | ICD-10-CM

## 2019-02-15 DIAGNOSIS — E871 Hypo-osmolality and hyponatremia: Secondary | ICD-10-CM | POA: Diagnosis not present

## 2019-02-15 DIAGNOSIS — F17210 Nicotine dependence, cigarettes, uncomplicated: Secondary | ICD-10-CM | POA: Diagnosis present

## 2019-02-15 HISTORY — DX: Personal history of diseases of the blood and blood-forming organs and certain disorders involving the immune mechanism: Z86.2

## 2019-02-15 HISTORY — DX: Acute embolism and thrombosis of unspecified deep veins of unspecified lower extremity: I82.409

## 2019-02-15 LAB — CBC WITH DIFFERENTIAL/PLATELET
Abs Immature Granulocytes: 0.02 10*3/uL (ref 0.00–0.07)
BASOS ABS: 0 10*3/uL (ref 0.0–0.1)
Basophils Relative: 1 %
EOS ABS: 0.1 10*3/uL (ref 0.0–0.5)
EOS PCT: 1 %
HCT: 40.7 % (ref 39.0–52.0)
Hemoglobin: 13.4 g/dL (ref 13.0–17.0)
IMMATURE GRANULOCYTES: 0 %
Lymphocytes Relative: 32 %
Lymphs Abs: 2 10*3/uL (ref 0.7–4.0)
MCH: 33.6 pg (ref 26.0–34.0)
MCHC: 32.9 g/dL (ref 30.0–36.0)
MCV: 102 fL — AB (ref 80.0–100.0)
MONOS PCT: 13 %
Monocytes Absolute: 0.8 10*3/uL (ref 0.1–1.0)
NEUTROS PCT: 53 %
Neutro Abs: 3.4 10*3/uL (ref 1.7–7.7)
Platelets: 148 10*3/uL — ABNORMAL LOW (ref 150–400)
RBC: 3.99 MIL/uL — ABNORMAL LOW (ref 4.22–5.81)
RDW: 14.6 % (ref 11.5–15.5)
WBC: 6.4 10*3/uL (ref 4.0–10.5)
nRBC: 0 % (ref 0.0–0.2)

## 2019-02-15 LAB — COMPREHENSIVE METABOLIC PANEL
ALK PHOS: 53 U/L (ref 38–126)
ALT: 10 U/L (ref 0–44)
ANION GAP: 8 (ref 5–15)
AST: 18 U/L (ref 15–41)
Albumin: 3 g/dL — ABNORMAL LOW (ref 3.5–5.0)
BUN: 12 mg/dL (ref 6–20)
CALCIUM: 8.1 mg/dL — AB (ref 8.9–10.3)
CO2: 23 mmol/L (ref 22–32)
Chloride: 107 mmol/L (ref 98–111)
Creatinine, Ser: 1.16 mg/dL (ref 0.61–1.24)
GFR calc non Af Amer: >60 mL/min (ref >60)
Glucose, Bld: 141 mg/dL — ABNORMAL HIGH (ref 70–99)
Potassium: 4 mmol/L (ref 3.5–5.1)
Sodium: 138 mmol/L (ref 135–145)
Total Bilirubin: 1.2 mg/dL (ref 0.3–1.2)
Total Protein: 6.4 g/dL — ABNORMAL LOW (ref 6.5–8.1)

## 2019-02-15 LAB — URINALYSIS, COMPLETE (UACMP) WITH MICROSCOPIC
BILIRUBIN URINE: NEGATIVE
Glucose, UA: NEGATIVE mg/dL
KETONES UR: NEGATIVE mg/dL
NITRITE: POSITIVE — AB
PH: 5 (ref 5.0–8.0)
PROTEIN: 100 mg/dL — AB
Specific Gravity, Urine: 1.012 (ref 1.005–1.030)

## 2019-02-15 LAB — TROPONIN I: Troponin I: <0.03 ng/mL (ref <0.03)

## 2019-02-15 LAB — BRAIN NATRIURETIC PEPTIDE: B Natriuretic Peptide: 3586 pg/mL — ABNORMAL HIGH (ref 0.0–100.0)

## 2019-02-15 MED ORDER — AMIODARONE LOAD VIA INFUSION
150.0000 mg | Freq: Once | INTRAVENOUS | Status: AC
  Administered 2019-02-15: 150 mg via INTRAVENOUS
  Filled 2019-02-15: qty 83.34

## 2019-02-15 MED ORDER — AMIODARONE LOAD VIA INFUSION
150.0000 mg | Freq: Once | INTRAVENOUS | Status: DC
  Filled 2019-02-15: qty 83.34

## 2019-02-15 MED ORDER — MIDODRINE HCL 5 MG PO TABS
5.0000 mg | ORAL_TABLET | Freq: Three times a day (TID) | ORAL | Status: DC
  Administered 2019-02-16 – 2019-02-22 (×19): 5 mg via ORAL
  Filled 2019-02-15 (×19): qty 1

## 2019-02-15 MED ORDER — SODIUM CHLORIDE 0.9% FLUSH
3.0000 mL | Freq: Two times a day (BID) | INTRAVENOUS | Status: DC
  Administered 2019-02-15 – 2019-02-28 (×26): 3 mL via INTRAVENOUS

## 2019-02-15 MED ORDER — ONDANSETRON HCL 4 MG/2ML IJ SOLN: 4.0000 mg | Freq: Four times a day (QID) | INTRAMUSCULAR | Status: DC | PRN

## 2019-02-15 MED ORDER — ACETAMINOPHEN 325 MG PO TABS
650.0000 mg | ORAL_TABLET | ORAL | Status: DC | PRN
  Administered 2019-02-18 – 2019-02-26 (×8): 650 mg via ORAL
  Filled 2019-02-15 (×8): qty 2

## 2019-02-15 MED ORDER — ENOXAPARIN SODIUM 80 MG/0.8ML ~~LOC~~ SOLN
1.0000 mg/kg | Freq: Two times a day (BID) | SUBCUTANEOUS | Status: DC
  Administered 2019-02-15 – 2019-02-16 (×3): 75 mg via SUBCUTANEOUS
  Filled 2019-02-15 (×3): qty 0.8

## 2019-02-15 MED ORDER — FUROSEMIDE 10 MG/ML IJ SOLN
20.0000 mg | Freq: Two times a day (BID) | INTRAMUSCULAR | Status: DC
  Administered 2019-02-16 – 2019-02-20 (×8): 20 mg via INTRAVENOUS
  Filled 2019-02-15 (×9): qty 2

## 2019-02-15 MED ORDER — ASPIRIN EC 81 MG PO TBEC
81.0000 mg | DELAYED_RELEASE_TABLET | Freq: Every day | ORAL | Status: DC
  Administered 2019-02-15 – 2019-02-28 (×14): 81 mg via ORAL
  Filled 2019-02-15 (×14): qty 1

## 2019-02-15 MED ORDER — SODIUM CHLORIDE 0.9% FLUSH
3.0000 mL | INTRAVENOUS | Status: DC | PRN
  Administered 2019-02-26: 3 mL via INTRAVENOUS
  Filled 2019-02-15: qty 3

## 2019-02-15 MED ORDER — AMIODARONE HCL IN DEXTROSE 360-4.14 MG/200ML-% IV SOLN
60.0000 mg/h | INTRAVENOUS | Status: DC
  Administered 2019-02-15 – 2019-02-16 (×2): 60 mg/h via INTRAVENOUS

## 2019-02-15 MED ORDER — LEVALBUTEROL HCL 1.25 MG/0.5ML IN NEBU
1.2500 mg | INHALATION_SOLUTION | Freq: Four times a day (QID) | RESPIRATORY_TRACT | Status: DC | PRN
  Filled 2019-02-15: qty 0.5

## 2019-02-15 MED ORDER — DIGOXIN 250 MCG PO TABS
0.2500 mg | ORAL_TABLET | Freq: Every day | ORAL | Status: DC
  Administered 2019-02-16 – 2019-02-19 (×4): 0.25 mg via ORAL
  Filled 2019-02-15 (×4): qty 1

## 2019-02-15 MED ORDER — AMIODARONE HCL IN DEXTROSE 360-4.14 MG/200ML-% IV SOLN
60.0000 mg/h | INTRAVENOUS | Status: DC
  Filled 2019-02-15: qty 200

## 2019-02-15 MED ORDER — DIGOXIN 0.25 MG/ML IJ SOLN
0.2500 mg | Freq: Once | INTRAMUSCULAR | Status: AC
  Administered 2019-02-15: 0.25 mg via INTRAVENOUS
  Filled 2019-02-15: qty 2

## 2019-02-15 MED ORDER — AMIODARONE HCL IN DEXTROSE 360-4.14 MG/200ML-% IV SOLN: 30.0000 mg/h | INTRAVENOUS | Status: DC

## 2019-02-15 MED ORDER — AMIODARONE HCL IN DEXTROSE 360-4.14 MG/200ML-% IV SOLN
30.0000 mg/h | INTRAVENOUS | Status: DC
  Administered 2019-02-16 – 2019-02-19 (×7): 30 mg/h via INTRAVENOUS
  Filled 2019-02-15 (×7): qty 200

## 2019-02-15 MED ORDER — SODIUM CHLORIDE 0.9 % IV SOLN
250.0000 mL | INTRAVENOUS | Status: DC | PRN
  Administered 2019-02-17 – 2019-02-19 (×3): 250 mL via INTRAVENOUS

## 2019-02-15 MED ORDER — FUROSEMIDE 10 MG/ML IJ SOLN
40.0000 mg | Freq: Once | INTRAMUSCULAR | Status: AC
  Administered 2019-02-15: 40 mg via INTRAVENOUS
  Filled 2019-02-15: qty 4

## 2019-02-15 NOTE — H&P (Addendum)
[facility] NAME: Kelvis Berger    MR#:  [id number]  DATE OF BIRTH:  April 27, 1961  DATE OF ADMISSION:  02/15/2019  PRIMARY CARE PHYSICIAN: Patient, No Pcp Per   REQUESTING/REFERRING PHYSICIAN: Delman Kitten MD  CHIEF COMPLAINT:   Chief Complaint  Patient presents with  . Shortness of Breath  . Leg Swelling    HISTORY OF PRESENT ILLNESS: Arlan Birks  is a 58 y.o. male with a known history of DVT, gastric ulcer, history ITP who is presenting to the hospital with complaint of shortness of breath and leg swelling.  Patient states that his symptoms started about 4 weeks ago when he started having shortness of breath which has progressively gotten worse.  He also has noticed swelling in his abdomen scrotum and lower extremity.  Patient in the emergency room was noted to have atrial flutter.  As well as borderline low blood pressure.  EDMD has spoken to cardiology.  Patient currently denies any chest pain or palpitations.  Denies any fevers or chills.  Does complaint of cough.  Also wheezing intermittently.    PAST MEDICAL HISTORY:   Past Medical History:  Diagnosis Date  . DVT (deep venous thrombosis) ([place])   . Gastric ulcer   . History of ITP     PAST SURGICAL HISTORY:  Past Surgical History:  Procedure Laterality Date  . gastric ulcer      SOCIAL HISTORY:  Social History   Tobacco Use  . Smoking status: Current Every Day Smoker  . Smokeless tobacco: Never Used  Substance Use Topics  . Alcohol use: Yes    FAMILY HISTORY:  Family History  Problem Relation Age of Onset  . CAD Father     DRUG ALLERGIES: No Known Allergies  REVIEW OF SYSTEMS:   CONSTITUTIONAL: No fever, fatigue or weakness.  EYES: No blurred or double vision.  EARS, NOSE, AND THROAT: No tinnitus or ear pain.  RESPIRATORY: Positive cough, positive shortness of breath, positive wheezing or hemoptysis.  CARDIOVASCULAR: No chest pain, positive orthopnea, positive  edema.  GASTROINTESTINAL: No nausea, vomiting, diarrhea or abdominal pain.  GENITOURINARY: No dysuria, hematuria.  ENDOCRINE: No polyuria, nocturia,  HEMATOLOGY: No anemia, easy bruising or bleeding SKIN: No rash or lesion. MUSCULOSKELETAL: No joint pain or arthritis.   NEUROLOGIC: No tingling, numbness, weakness.  PSYCHIATRY: No anxiety or depression.   MEDICATIONS AT HOME:  Prior to Admission medications   Not on File      PHYSICAL EXAMINATION:   VITAL SIGNS: Blood pressure 97/85, pulse (!) 140, temperature 97.6 F (36.4 C), temperature source Oral, resp. rate 20, height 5\' 8"  (1.727 m), weight 72.6 kg, SpO2 95 %.  GENERAL:  58 y.o.-year-old patient lying in the bed with no acute distress.  EYES: Pupils equal, round, reactive to light and accommodation. No scleral icterus. Extraocular muscles intact.  HEENT: Head atraumatic, normocephalic. Oropharynx and nasopharynx clear.  NECK:  Supple, no jugular venous distention. No thyroid enlargement, no tenderness.  LUNGS: Bilateral crackles throughout both lungs CARDIOVASCULAR: Irregularly irregular ABDOMEN: Soft, nontender, nondistended. Bowel sounds present. No organomegaly or mass.  EXTREMITIES: 2+ pedal edema, cyanosis, or clubbing.  NEUROLOGIC: Cranial nerves II through XII are intact. Muscle strength 5/5 in all extremities. Sensation intact. Gait not checked.  PSYCHIATRIC: The patient is alert and oriented x 3.  SKIN: No obvious rash, lesion, or ulcer.   LABORATORY PANEL:   CBC Recent Labs  Lab 02/15/19 1803  WBC 6.4  HGB 13.4  HCT 40.7  PLT 148*  MCV 102.0*  MCH 33.6  MCHC 32.9  RDW 14.6  LYMPHSABS 2.0  MONOABS 0.8  EOSABS 0.1  BASOSABS 0.0   ------------------------------------------------------------------------------------------------------------------  Chemistries  Recent Labs  Lab 02/15/19 1803  NA 138  K 4.0  CL 107  CO2 23  GLUCOSE 141*  BUN 12  CREATININE 1.16  CALCIUM 8.1*  AST 18  ALT 10   ALKPHOS 53  BILITOT 1.2   ------------------------------------------------------------------------------------------------------------------ estimated creatinine clearance is 67.2 mL/min (by C-G formula based on SCr of 1.16 mg/dL). ------------------------------------------------------------------------------------------------------------------ No results for input(s): TSH, T4TOTAL, T3FREE, THYROIDAB in the last 72 hours.  Invalid input(s): FREET3   Coagulation profile No results for input(s): INR, PROTIME in the last 168 hours. ------------------------------------------------------------------------------------------------------------------- No results for input(s): DDIMER in the last 72 hours. -------------------------------------------------------------------------------------------------------------------  Cardiac Enzymes Recent Labs  Lab 02/15/19 1803  TROPONINI <0.03   ------------------------------------------------------------------------------------------------------------------ Invalid input(s): POCBNP  ---------------------------------------------------------------------------------------------------------------  Urinalysis    Component Value Date/Time   COLORURINE YELLOW (A) 07/14/2015 2032   APPEARANCEUR CLEAR (A) 07/14/2015 2032   APPEARANCEUR Clear 07/07/2014 0144   LABSPEC 1.013 07/14/2015 2032   LABSPEC 1.021 07/07/2014 0144   PHURINE 6.0 07/14/2015 2032   GLUCOSEU NEGATIVE 07/14/2015 2032   GLUCOSEU Negative 07/07/2014 0144   HGBUR 1+ (A) 07/14/2015 2032   BILIRUBINUR NEGATIVE 07/14/2015 2032   BILIRUBINUR Negative 07/07/2014 0144   KETONESUR NEGATIVE 07/14/2015 2032   PROTEINUR NEGATIVE 07/14/2015 2032   NITRITE POSITIVE (A) 07/14/2015 2032   LEUKOCYTESUR 2+ (A) 07/14/2015 2032   LEUKOCYTESUR Negative 07/07/2014 0144     RADIOLOGY: Dg Chest 2 View  Result Date: 02/15/2019 CLINICAL DATA:  Increased edema starting 3 weeks ago. Shortness of  breath starting 3 days ago. EXAM: CHEST - 2 VIEW COMPARISON:  Chest x-ray dated 07/06/2014. FINDINGS: Mild cardiomegaly. Central pulmonary vascular congestion and bibasilar opacities. Probable small bilateral pleural effusions. No pneumothorax seen. No acute appearing osseous abnormality. IMPRESSION: 1. Cardiomegaly with central pulmonary vascular congestion suggesting mild CHF/volume overload. 2. Bibasilar opacities are likely a combination of atelectasis and small pleural effusions. Electronically Signed   By: Franki Cabot M.D.   On: 02/15/2019 19:05    EKG: Orders placed or performed during the hospital encounter of 02/15/19  . EKG 12-Lead  . EKG 12-Lead  . ED EKG  . ED EKG  . EKG 12-Lead  . EKG 12-Lead    IMPRESSION AND PLAN: Patient is a 58 year old white male with no known medical history of congestive heart failure presents with shortness of breath and swelling of the lower extremity  1.  Acute CHF type unknown Due to low blood pressure I will give him low-dose IV Lasix for now Echocardiogram of the heart Cardiology has been consulted  2.  A. fib/a flutter with rapid ventricular rate due to low blood pressure I will start patient on amiodarone I will start patient on full dose Lovenox Check a TSH To assist with blood pressure improvement for better rate control and diuresis I will start patient on midodrine  3.  Bronchospasm will use Xopenex nebs  4.  Nicotine abuse smoking cessation provided 4 minutes spent strongly recommend patient stop smoking, nicotine patch offered patient does not want a nicotine patch  5.  Miscellaneous Lovenox for DVT prophylaxis     All the records are reviewed and case discussed with ED provider. Management plans discussed with the patient, family and they are in agreement.  CODE STATUS: Full  TOTAL TIME TAKING CARE OF THIS PATIENT: 55 minutes.    Dustin Flock M.D on 02/15/2019 at 7:15 PM  Between 7am to 6pm - Pager -  [phone]  After 6pm go to [email or web address] - password [email or web address] [facility]  [employer] Office  [phone]  CC: Primary care physician; Patient, No Pcp Per

## 2019-02-15 NOTE — Progress Notes (Signed)
Advanced care plan.  Purpose of the Encounter: CODE STATUS  Parties in Attendance: Patient himself  Patient's Decision Capacity: Intact  Subjective/Patient's story: Ricky Ray  is a 58 y.o. male with a known history of DVT, gastric ulcer, history ITP who is presenting to the hospital with complaint of shortness of breath and leg swelling.  Patient states that his symptoms started about 4 weeks ago when he started having shortness of breath which has progressively gotten worse.  He also has noticed swelling in his abdomen scrotum and lower extremity.  Patient in the emergency room was noted to have atrial flutter.  As well as borderline low blood pressure.    Objective/Medical story  I discussed with the patient regarding his new diagnosis of CHF and overall poor prognosis associated with this.  Asked him about his wishes for cardiac and pulmonary resuscitation.  Also asked if he had a living will and healthcare power of attorney.  Goals of care determination:  Patient states that he would like to be a full code and would like everything to be done.  He will consider healthcare power of attorney and living will in the near future   CODE STATUS: Full code   Time spent discussing advanced care planning: 16 minutes

## 2019-02-15 NOTE — ED Provider Notes (Signed)
[facility] Emergency Department Provider Note ____________________________________________   First MD Initiated Contact with Patient 02/15/19 1756     (approximate)  I have reviewed the triage vital signs and the nursing notes.  HISTORY  Chief Complaint Shortness of Breath and Leg Swelling  HPI Ricky Ray is a 58 y.o. male reports really no significant medical history  Patient reports about 3 to 4 weeks now has had increasing feeling of shortness of breath especially when he is walking or trying to lay down at night.  Is legs became very swollen and his stomach became very swollen.  Not painful.  No chest pain.  Ports that just worsening to the point he started to feel short of breath at all times.  He was placed on oxygen and he reports his breathing does feel better now.  He reports this may have happened once in the past years ago when he was hospitalized for some but he cannot remember what it was.  Otherwise denies any history of heart failure liver disease cirrhosis edema   Currently reports he really only goes to the hospital if he has a problem.  Does not see a doctor regularly.  Does not take any medications  Past Medical History:  Diagnosis Date  . DVT (deep venous thrombosis) ([place])   . Gastric ulcer   . History of ITP     Patient Active Problem List   Diagnosis Date Noted  . Acute CHF ([place]) 02/15/2019    Past Surgical History:  Procedure Laterality Date  . gastric ulcer      Prior to Admission medications   Not on File    Allergies Patient has no known allergies.  Family History  Problem Relation Age of Onset  . CAD Father     Social History Social History   Tobacco Use  . Smoking status: Current Every Day Smoker  . Smokeless tobacco: Never Used  Substance Use Topics  . Alcohol use: Yes  . Drug use: Not on file    Review of Systems Constitutional: No fever/chills Eyes: No visual changes. ENT: No sore throat.  Cardiovascular: Denies chest pain. Respiratory: See HPI gastrointestinal: No abdominal pain.  Both his belly and lower legs have been quite swollen increasing over 3 to 4 weeks Genitourinary: Negative for dysuria. Musculoskeletal: Negative for back pain. Skin: Negative for rash. Neurological: Negative for headaches, areas of focal weakness or numbness.    ____________________________________________   PHYSICAL EXAM:  VITAL SIGNS: ED Triage Vitals  Enc Vitals Group     BP 02/15/19 1800 105/87     Pulse Rate 02/15/19 1800 (!) 140     Resp 02/15/19 1800 (!) 25     Temp 02/15/19 1800 97.6 F (36.4 C)     Temp Source 02/15/19 1800 Oral     SpO2 02/15/19 1800 (!) 89 %     Weight 02/15/19 1755 160 lb (72.6 kg)     Height 02/15/19 1755 5\' 8"  (1.727 m)     Head Circumference --      Peak Flow --      Pain Score 02/15/19 1755 0     Pain Loc --      Pain Edu? --      Excl. in [place]? --     Constitutional: Alert and oriented.  Mildly ill-appearing, slightly tachypneic.  Present mild increased work of breathing and slight distress Eyes: Conjunctivae are normal. Head: Atraumatic. Nose: No congestion/rhinnorhea. Mouth/Throat: Mucous membranes are moist.  Neck: No stridor.  Cardiovascular: Tachycardic and regular at about 130-1 40 rate, regular rhythm. Grossly normal heart sounds.  Good peripheral circulation. Respiratory: Mild tachypnea.  Slight pallor of the nailbeds bilaterally.  Rales in the lower bases bilaterally, slightly diminished as well.  Speaks in full sentences.  Normal oxygen saturation on 2 L, EMS reports hypoxia without oxygen Gastrointestinal: Soft and nontender. No distention. Musculoskeletal: No lower extremity tenderness but anasarca bilateral Neurologic:  Normal speech and language. No gross focal neurologic deficits are appreciated.  Skin:  Skin is warm, dry and intact. No rash noted. Psychiatric: Mood and affect are normal. Speech and behavior are normal.   ____________________________________________   LABS (all labs ordered are listed, but only abnormal results are displayed)  Labs Reviewed  COMPREHENSIVE METABOLIC PANEL - Abnormal; Notable for the following components:      Result Value   Glucose, Bld 141 (*)    Calcium 8.1 (*)    Total Protein 6.4 (*)    Albumin 3.0 (*)    All other components within normal limits  CBC WITH DIFFERENTIAL/PLATELET - Abnormal; Notable for the following components:   RBC 3.99 (*)    MCV 102.0 (*)    Platelets 148 (*)    All other components within normal limits  BRAIN NATRIURETIC PEPTIDE - Abnormal; Notable for the following components:   B Natriuretic Peptide 3,586.0 (*)    All other components within normal limits  TROPONIN I  URINALYSIS, COMPLETE (UACMP) WITH MICROSCOPIC   ____________________________________________  EKG  Probable atrial flutter, 2-1 rate 140.  Nonspecific T wave abnormality. PR 180 QTc 470 No evidence of acute ST elevation ____________________________________________  RADIOLOGY  Dg Chest 2 View  Result Date: 02/15/2019 CLINICAL DATA:  Increased edema starting 3 weeks ago. Shortness of breath starting 3 days ago. EXAM: CHEST - 2 VIEW COMPARISON:  Chest x-ray dated 07/06/2014. FINDINGS: Mild cardiomegaly. Central pulmonary vascular congestion and bibasilar opacities. Probable small bilateral pleural effusions. No pneumothorax seen. No acute appearing osseous abnormality. IMPRESSION: 1. Cardiomegaly with central pulmonary vascular congestion suggesting mild CHF/volume overload. 2. Bibasilar opacities are likely a combination of atelectasis and small pleural effusions. Electronically Signed   By: Franki Cabot M.D.   On: 02/15/2019 19:05    Chest x-ray reviewed.  Findings most consistent with congestive heart failure for my review ____________________________________________   PROCEDURES  Procedure(s) performed: None  Procedures  Critical Care performed: No    ____________________________________________   INITIAL IMPRESSION / ASSESSMENT AND PLAN / ED COURSE  Pertinent labs & imaging results that were available during my care of the patient were reviewed by me and considered in my medical decision making (see chart for details).   Increasing dyspnea.  Clinical exam noted for JVD, notable anasarca.  Stress patient on checks x-ray and history likely congestive heart failure, less likely cirrhosis or severe protein malnutrition/third spacing.  Denies chest pain.  No evidence of acute cardiac ischemia.  Persistent tachycardia that appears to be likely flutter.  Consulted with cardiology, trial Lasix, admit and then likely if not improving and switch to amiodarone therapy per Dr. call would  Clinical Course as of Feb 15 1952  Sat Feb 15, 2019  1842 Discussed with Dr. Clayborn Bigness. Suspect CHF. Advises start with diuresis and if still tachycardic after diuresis would consider amiodarone.    [MQ]  1843 Patient results highly suggestive of congestive heart failure.  Discussed with cardiology will start IV Lasix and monitor for improvement.  At the present time,  do not feel the patient would be stable for transfer to the [facility] and services are provided here and he is tachycardic, slightly hypotensive, would seem the benefits of transfer are outweighed by the risks of decompensation at this particular time.   [MQ]    Clinical Course User Index [MQ] Delman Kitten, MD    No infectious symptomatology.  Worsening symptoms over 3 to 4 weeks. ____________________________________________   FINAL CLINICAL IMPRESSION(S) / ED DIAGNOSES  Final diagnoses:  Atrial flutter, unspecified type ([place])  Congestive heart failure, unspecified HF chronicity, unspecified heart failure type [facility])        Note:  This document was prepared using Dragon voice recognition software and may include unintentional dictation errors       Delman Kitten, MD 02/15/19  1953

## 2019-02-15 NOTE — ED Notes (Signed)
Transport to floor room 234.AS 

## 2019-02-15 NOTE — ED Notes (Signed)
ED TO INPATIENT HANDOFF REPORT  ED Nurse Name and Phone #:  Paige [phone] Name/Age/Gender Ricky Ray 58 y.o. male Room/Bed: ED04A/ED04A  Code Status   Code Status: Not on file  Home/SNF/Other Homehome Patient oriented to: self a&ox3 Is this baseline? Yes   Triage Complete: Triage complete  Chief Complaint edema  Triage Note Increased edema starting 3 weeks ago- SHOB starting 3 days ago- per ems abdomen is tight and nontender   Allergies No Known Allergies  Level of Care/Admitting Diagnosis ED Disposition    ED Disposition Condition [facility] Area: [place] [100120]  Level of Care: Telemetry [5]  Diagnosis: Acute CHF [facility]) [[id number]]  Admitting Physician: Dustin Flock [[id number]]  Attending Physician: Dustin Flock [phone]  Estimated length of stay: past midnight tomorrow  Certification:: I certify this patient will need inpatient services for at least 2 midnights  PT Class (Do Not Modify): Inpatient [101]  PT Acc Code (Do Not Modify): Private [1]       B Medical/Surgery History Past Medical History:  Diagnosis Date  . DVT (deep venous thrombosis) ([place])   . Gastric ulcer   . History of ITP    Past Surgical History:  Procedure Laterality Date  . gastric ulcer       A IV Location/Drains/Wounds Patient Lines/Drains/Airways Status   Active Line/Drains/Airways    Name:   Placement date:   Placement time:   Site:   Days:   Peripheral IV 02/15/19 Left Antecubital   02/15/19    1802    Antecubital   less than 1          Intake/Output Last 24 hours No intake or output data in the 24 hours ending 02/15/19 1947  Labs/Imaging Results for orders placed or performed during the hospital encounter of 02/15/19 (from the past 48 hour(s))  Troponin I - ONCE - STAT     Status: None   Collection Time: 02/15/19  6:03 PM  Result Value Ref Range   Troponin I <0.03 <0.03 ng/mL    Comment: Performed at [facility],  [address] [place], [place] [address]  Comprehensive metabolic panel     Status: Abnormal   Collection Time: 02/15/19  6:03 PM  Result Value Ref Range   Sodium 138 135 - 145 mmol/L   Potassium 4.0 3.5 - 5.1 mmol/L   Chloride 107 98 - 111 mmol/L   CO2 23 22 - 32 mmol/L   Glucose, Bld 141 (H) 70 - 99 mg/dL   BUN 12 6 - 20 mg/dL   Creatinine, Ser 1.16 0.61 - 1.24 mg/dL   Calcium 8.1 (L) 8.9 - 10.3 mg/dL   Total Protein 6.4 (L) 6.5 - 8.1 g/dL   Albumin 3.0 (L) 3.5 - 5.0 g/dL   AST 18 15 - 41 U/L   ALT 10 0 - 44 U/L   Alkaline Phosphatase 53 38 - 126 U/L   Total Bilirubin 1.2 0.3 - 1.2 mg/dL   GFR calc non Af Amer >60 >60 mL/min   GFR calc Af Amer >60 >60 mL/min   Anion gap 8 5 - 15    Comment: Performed at [facility], [address] [place], [place] [address]  CBC with Differential     Status: Abnormal   Collection Time: 02/15/19  6:03 PM  Result Value Ref Range   WBC 6.4 4.0 - 10.5 K/uL   RBC 3.99 (L) 4.22 - 5.81 MIL/uL  Hemoglobin 13.4 13.0 - 17.0 g/dL   HCT 40.7 39.0 - 52.0 %   MCV 102.0 (H) 80.0 - 100.0 fL   MCH 33.6 26.0 - 34.0 pg   MCHC 32.9 30.0 - 36.0 g/dL   RDW 14.6 11.5 - 15.5 %   Platelets 148 (L) 150 - 400 K/uL   nRBC 0.0 0.0 - 0.2 %   Neutrophils Relative % 53 %   Neutro Abs 3.4 1.7 - 7.7 K/uL   Lymphocytes Relative 32 %   Lymphs Abs 2.0 0.7 - 4.0 K/uL   Monocytes Relative 13 %   Monocytes Absolute 0.8 0.1 - 1.0 K/uL   Eosinophils Relative 1 %   Eosinophils Absolute 0.1 0.0 - 0.5 K/uL   Basophils Relative 1 %   Basophils Absolute 0.0 0.0 - 0.1 K/uL   Immature Granulocytes 0 %   Abs Immature Granulocytes 0.02 0.00 - 0.07 K/uL    Comment: Performed at [facility], [address], [place], [place] [address]  Brain natriuretic peptide     Status: Abnormal   Collection Time: 02/15/19  6:03 PM  Result Value Ref Range   B Natriuretic Peptide 3,586.0 (H) 0.0 - 100.0 pg/mL    Comment: Performed at [facility], [address] [place], [place] [address]   Dg Chest 2 View  Result Date: 02/15/2019 CLINICAL DATA:  Increased edema starting 3 weeks ago. Shortness of breath starting 3 days ago. EXAM: CHEST - 2 VIEW COMPARISON:  Chest x-ray dated 07/06/2014. FINDINGS: Mild cardiomegaly. Central pulmonary vascular congestion and bibasilar opacities. Probable small bilateral pleural effusions. No pneumothorax seen. No acute appearing osseous abnormality. IMPRESSION: 1. Cardiomegaly with central pulmonary vascular congestion suggesting mild CHF/volume overload. 2. Bibasilar opacities are likely a combination of atelectasis and small pleural effusions. Electronically Signed   By: Franki Cabot M.D.   On: 02/15/2019 19:05    Pending Labs Unresulted Labs (From admission, onward)    Start     Ordered   02/15/19 1800  Urinalysis, Complete w Microscopic  Once,   STAT     02/15/19 1800   Signed and Held  HIV antibody (Routine Testing)  Once,   R     Signed and Held   Signed and Held  Basic metabolic panel  Daily,   R     Signed and Held          Vitals/Pain Today's Vitals   02/15/19 1837 02/15/19 1845 02/15/19 1915 02/15/19 1930  BP: 97/84 97/85 99/86  93/82  Pulse:      Resp: (!) 23 20 (!) 26 (!) 26  Temp:      TempSrc:      SpO2:      Weight:      Height:      PainSc:        Isolation Precautions No active isolations  Medications Medications  digoxin (LANOXIN) 0.25 MG/ML injection 0.25 mg (has no administration in time range)  amiodarone (NEXTERONE) 1.8 mg/mL load via infusion 150 mg (has no administration in time range)    Followed by  amiodarone (NEXTERONE PREMIX) 360-4.14 MG/200ML-% (1.8 mg/mL) IV infusion (has no administration in time range)    Followed by  amiodarone (NEXTERONE PREMIX) 360-4.14 MG/200ML-% (1.8 mg/mL) IV infusion (has no administration in time range)  enoxaparin (LOVENOX) injection 75 mg (has no administration in time range)  midodrine (PROAMATINE) tablet 5 mg (has no administration  in time range)  levalbuterol (XOPENEX) nebulizer solution 1.25 mg (has no  administration in time range)  furosemide (LASIX) injection 40 mg (40 mg Intravenous Given 02/15/19 1914)    Mobility walks Low fall risk   Focused Assessments Cardiac Assessment Handoff:  Cardiac Rhythm: Sinus tachycardia(140) Lab Results  Component Value Date   TROPONINI <0.03 02/15/2019   No results found for: DDIMER Does the Patient currently have chest pain? No     R Recommendations: See Admitting Provider Note  Report given to:   Additional Notes:

## 2019-02-15 NOTE — ED Triage Notes (Signed)
Increased edema starting 3 weeks ago- SHOB starting 3 days ago- per ems abdomen is tight and nontender

## 2019-02-16 ENCOUNTER — Inpatient Hospital Stay
Admit: 2019-02-16 | Discharge: 2019-02-16 | Disposition: A | Discharge status: Home / Self Care | Payer: No Typology Code available for payment source | Attending: Internal Medicine | Admitting: Internal Medicine

## 2019-02-16 ENCOUNTER — Other Ambulatory Visit: Payer: Self-pay

## 2019-02-16 LAB — BASIC METABOLIC PANEL
Anion gap: 10 (ref 5–15)
BUN: 13 mg/dL (ref 6–20)
CO2: 20 mmol/L — ABNORMAL LOW (ref 22–32)
Calcium: 8.1 mg/dL — ABNORMAL LOW (ref 8.9–10.3)
Chloride: 108 mmol/L (ref 98–111)
Creatinine, Ser: 1.2 mg/dL (ref 0.61–1.24)
GFR calc Af Amer: >60 mL/min (ref >60)
GFR calc non Af Amer: >60 mL/min (ref >60)
Glucose, Bld: 115 mg/dL — ABNORMAL HIGH (ref 70–99)
Potassium: 4.4 mmol/L (ref 3.5–5.1)
Sodium: 138 mmol/L (ref 135–145)

## 2019-02-16 MED ORDER — DIGOXIN 0.25 MG/ML IJ SOLN
0.2500 mg | Freq: Once | INTRAMUSCULAR | Status: AC
  Administered 2019-02-16: 0.25 mg via INTRAVENOUS
  Filled 2019-02-16: qty 2

## 2019-02-16 MED ORDER — ORAL CARE MOUTH RINSE
15.0000 mL | Freq: Two times a day (BID) | OROMUCOSAL | Status: DC
  Administered 2019-02-18 – 2019-02-22 (×5): 15 mL via OROMUCOSAL

## 2019-02-16 MED ORDER — SODIUM CHLORIDE 0.9 % IV SOLN
1.0000 g | INTRAVENOUS | Status: DC
  Administered 2019-02-16 – 2019-02-19 (×4): 1 g via INTRAVENOUS
  Filled 2019-02-16 (×2): qty 1
  Filled 2019-02-16: qty 10
  Filled 2019-02-16 (×2): qty 1

## 2019-02-16 NOTE — Plan of Care (Signed)
  Problem: Education: Goal: Knowledge of General Education information will improve Description: Including pain rating scale, medication(s)/side effects and non-pharmacologic comfort measures Outcome: Progressing   Problem: Clinical Measurements: Goal: Respiratory complications will improve Outcome: Progressing   Problem: Safety: Goal: Ability to remain free from injury will improve Outcome: Progressing   

## 2019-02-16 NOTE — Progress Notes (Addendum)
Pt was admitted with VSS except HR 135 and BP 108/80. Pt started on amio gtt at 2132.  Call bell at reach and educated about safety. Pt handed the book heart failure management. Will continue to monitor.  Update 0246: Pt amio gtt was about to change at 0300 to 16.67. HR still at 134. Talked to Dr. Marcille Blanco and states to go forward with the order. Will continue to monitor.  Update 0300: Amio gtt rate was changed from 33.3 ml/hr to 16.67 ml/hr. Will continue to monitor.

## 2019-02-16 NOTE — Progress Notes (Addendum)
[facility] [facility] [place] NAME: Ricky Ray    MR#:  [id number]  DATE OF BIRTH:  [birth year]  SUBJECTIVE:  CHIEF COMPLAINT:   Chief Complaint  Patient presents with  . Shortness of Breath  . Leg Swelling   The patient still has shortness of breath and cough, leg swelling up to abdomen, oxygen by nasal cannula 3 L.  Still tachycardia at 120s.  On amiodarone drip. REVIEW OF SYSTEMS:  Review of Systems  Constitutional: Positive for malaise/fatigue. Negative for chills and fever.  HENT: Negative for sore throat.   Eyes: Negative for blurred vision and double vision.  Respiratory: Positive for cough and shortness of breath. Negative for hemoptysis, sputum production, wheezing and stridor.   Cardiovascular: Positive for leg swelling. Negative for chest pain, palpitations and orthopnea.  Gastrointestinal: Negative for abdominal pain, blood in stool, diarrhea, melena, nausea and vomiting.  Genitourinary: Negative for dysuria, flank pain and hematuria.  Musculoskeletal: Negative for back pain and joint pain.  Neurological: Negative for dizziness, sensory change, focal weakness, seizures, loss of consciousness, weakness and headaches.  Endo/Heme/Allergies: Negative for polydipsia.  Psychiatric/Behavioral: Negative for depression. The patient is not nervous/anxious.     DRUG ALLERGIES:  No Known Allergies VITALS:  Blood pressure 93/78, pulse (!) 125, temperature 100 F (37.8 C), temperature source Oral, resp. rate 20, height 5\' 8"  (1.727 m), weight 90.8 kg, SpO2 96 %. PHYSICAL EXAMINATION:  Physical Exam HENT:     Head: Normocephalic.     Mouth/Throat:     Mouth: Mucous membranes are moist.  Eyes:     General: No scleral icterus.    Conjunctiva/sclera: Conjunctivae normal.     Pupils: Pupils are equal, round, and reactive to light.  Neck:     Musculoskeletal: Normal range of motion and neck supple.     Vascular: No JVD.     Trachea: No  tracheal deviation.  Cardiovascular:     Rate and Rhythm: Normal rate and regular rhythm.     Heart sounds: Normal heart sounds. No murmur. No gallop.   Pulmonary:     Effort: Pulmonary effort is normal. No respiratory distress.     Breath sounds: Rales present. No wheezing.  Abdominal:     General: Bowel sounds are normal. There is no distension.     Palpations: Abdomen is soft.     Tenderness: There is no abdominal tenderness. There is no rebound.  Musculoskeletal: Normal range of motion.        General: No tenderness.     Right lower leg: Edema present.     Left lower leg: Edema present.     Comments: Bilateral leg swelling up to thighs and abdomen  Skin:    Findings: No erythema or rash.  Neurological:     Mental Status: He is alert and oriented to person, place, and time.     Cranial Nerves: No cranial nerve deficit.  Psychiatric:        Mood and Affect: Mood normal.    LABORATORY PANEL:  Male CBC Recent Labs  Lab 02/15/19 1803  WBC 6.4  HGB 13.4  HCT 40.7  PLT 148*   ------------------------------------------------------------------------------------------------------------------ Chemistries  Recent Labs  Lab 02/15/19 1803 02/16/19 0409  NA 138 138  K 4.0 4.4  CL 107 108  CO2 23 20*  GLUCOSE 141* 115*  BUN 12 13  CREATININE 1.16 1.20  CALCIUM 8.1* 8.1*  AST 18  --   ALT 10  --  ALKPHOS 53  --   BILITOT 1.2  --    RADIOLOGY:  Dg Chest 2 View  Result Date: 02/15/2019 CLINICAL DATA:  Increased edema starting 3 weeks ago. Shortness of breath starting 3 days ago. EXAM: CHEST - 2 VIEW COMPARISON:  Chest x-ray dated 07/06/2014. FINDINGS: Mild cardiomegaly. Central pulmonary vascular congestion and bibasilar opacities. Probable small bilateral pleural effusions. No pneumothorax seen. No acute appearing osseous abnormality. IMPRESSION: 1. Cardiomegaly with central pulmonary vascular congestion suggesting mild CHF/volume overload. 2. Bibasilar opacities are  likely a combination of atelectasis and small pleural effusions. Electronically Signed   By: Franki Cabot M.D.   On: 02/15/2019 19:05   ASSESSMENT AND PLAN:   Patient is a 58 year old white male with no known medical history of congestive heart failure presents with shortness of breath and swelling of the lower extremity  1.  Acute CHF type unknown continue low-dose IV Lasix due to low side blood pressure. Follow-up echocardiograph and cardiology consult.  2.  A. fib/a flutter with rapid ventricular rate; cardiac thrombosis. Continue amiodarone drip and Lovenox.  3.  Bronchospasm, Xopenex nebs  4.  Nicotine abuse smoking cessation provided 4 minutes spent strongly recommend patient stop smoking, nicotine patch offered patient does not want a nicotine patch  UTI: on rocephin.  All the records are reviewed and case discussed with Care Management/Social Worker. Management plans discussed with the patient, family and they are in agreement.  CODE STATUS: Full Code  TOTAL TIME TAKING CARE OF THIS PATIENT: 36 minutes.   More than 50% of the time was spent in counseling/coordination of care: YES  POSSIBLE D/C IN 3 DAYS, DEPENDING ON CLINICAL CONDITION.   Demetrios Loll M.D on 02/16/2019 at 1:50 PM  Between 7am to 6pm - Pager - [phone]  After 6pm go to [email or web address] - [facility]

## 2019-02-16 NOTE — Progress Notes (Signed)
Subjective:  Patient still has some shortness of breath somewhat better still has leg edema somewhat improved still has tachycardia denies any pain  Objective:  Vital Signs in the last 24 hours: Temp:  [98.1 F (36.7 C)-100 F (37.8 C)] 98.2 F (36.8 C) (03/22 1928) Pulse Rate:  [32-139] 127 (03/22 1928) Resp:  [20] 20 (03/22 1928) BP: (90-121)/(54-95) 105/89 (03/22 1928) SpO2:  [88 %-98 %] 97 % (03/22 1928) Weight:  [90.8 kg] 90.8 kg (03/22 0416)  Intake/Output from previous day: 03/21 0701 - 03/22 0700 In: 451.7 [P.O.:240; I.V.:211.7] Out: 420 [Urine:420] Intake/Output from this shift: Total I/O In: -  Out: 400 [Urine:400]  Physical Exam: General appearance: appears older than stated age Neck: no adenopathy, no JVD, supple, symmetrical, trachea midline and thyroid not enlarged, symmetric, no tenderness/mass/nodules Lungs: diminished breath sounds bibasilar and bilaterally, dullness to percussion bibasilar and bilaterally, rales bibasilar and bilaterally and rhonchi bibasilar and bilaterally Heart: regularly irregular rhythm and Tachycardia rate of 924 systolic S3 Abdomen: Abdominal distention and edema scar in the mid abdomen Extremities: edema 3+ lower extremity edema Pulses: 2+ and symmetric Skin: Skin color, texture, turgor normal. No rashes or lesions Neurologic: Alert and oriented X 3, normal strength and tone. Normal symmetric reflexes. Normal coordination and gait  Lab Results: Recent Labs    02/15/19 1803  WBC 6.4  HGB 13.4  PLT 148*   Recent Labs    02/15/19 1803 02/16/19 0409  NA 138 138  K 4.0 4.4  CL 107 108  CO2 23 20*  GLUCOSE 141* 115*  BUN 12 13  CREATININE 1.16 1.20   Recent Labs    02/15/19 1803  TROPONINI <0.03   Hepatic Function Panel Recent Labs    02/15/19 1803  PROT 6.4*  ALBUMIN 3.0*  AST 18  ALT 10  ALKPHOS 53  BILITOT 1.2   No results for input(s): CHOL in the last 72 hours. No results for input(s): PROTIME in the  last 72 hours.  Imaging: Imaging results have been reviewed  Cardiac Studies:  Assessment/Plan:  Arrhythmia Cardiomyopathy CHF Edema Palpitations Shortness of Breath  . Plan Continue telemetry follow-up troponins EKGs Continue diuretic therapy for heart failure Recommend ACE inhibitor beta-blocker consider Entresto Agree with echocardiogram for evaluation of cardiomyopathy Cardiac masses in the RV unclear etiology possibly malignant would recommend TEE for further evaluation Continue amiodarone therapy for tachycardia and rhythm control Maintain short-term IV anticoagulation for now Do not recommend an invasive strategy at this point Advised patient to refrain from tobacco abuse Consider etiology of cardiomyopathy  LOS: 1 day    Helena Sardo D Jameek Bruntz 02/16/2019, 11:58 PM

## 2019-02-16 NOTE — Consult Note (Signed)
Reason for Consult: Congestive heart failure leg edema Referring Physician: Dr. Posey Pronto hospitalist [facility]  Ricky Ray is an 58 y.o. male.  HPI: Patient presents history of DVT gastric ulcer ITP in the past complains of shortness of breath previous history of alcohol abuse as well as smoking.  With worsening dyspnea shortness of breath finally came to the emergency room by the urging of his sister for further assessment evaluation complains of vague chest discomfort as well but has had progressive dyspnea shortness of breath and leg swelling.  Patient has not seen a physician in quite some time patient also had palpitations and tachycardia he has had a cough with congestion and intermittent wheezing.  Denies any previous cardiac history  Past Medical History:  Diagnosis Date  . DVT (deep venous thrombosis) ([place])   . Gastric ulcer   . History of ITP     Past Surgical History:  Procedure Laterality Date  . gastric ulcer      Family History  Problem Relation Age of Onset  . CAD Father     Social History:  reports that he has been smoking. He has never used smokeless tobacco. He reports current alcohol use. No history on file for drug.  Allergies: No Known Allergies  Medications: I have reviewed the patient's current medications.  Results for orders placed or performed during the hospital encounter of 02/15/19 (from the past 48 hour(s))  Troponin I - ONCE - STAT     Status: None   Collection Time: 02/15/19  6:03 PM  Result Value Ref Range   Troponin I <0.03 <0.03 ng/mL    Comment: Performed at [facility], [address] [place], [place] [address]  Comprehensive metabolic panel     Status: Abnormal   Collection Time: 02/15/19  6:03 PM  Result Value Ref Range   Sodium 138 135 - 145 mmol/L   Potassium 4.0 3.5 - 5.1 mmol/L   Chloride 107 98 - 111 mmol/L   CO2 23 22 - 32 mmol/L   Glucose, Bld 141 (H) 70 - 99 mg/dL   BUN 12 6 - 20 mg/dL   Creatinine,  Ser 1.16 0.61 - 1.24 mg/dL   Calcium 8.1 (L) 8.9 - 10.3 mg/dL   Total Protein 6.4 (L) 6.5 - 8.1 g/dL   Albumin 3.0 (L) 3.5 - 5.0 g/dL   AST 18 15 - 41 U/L   ALT 10 0 - 44 U/L   Alkaline Phosphatase 53 38 - 126 U/L   Total Bilirubin 1.2 0.3 - 1.2 mg/dL   GFR calc non Af Amer >60 >60 mL/min   GFR calc Af Amer >60 >60 mL/min   Anion gap 8 5 - 15    Comment: Performed at [facility], [address] [place], [place] [address]  CBC with Differential     Status: Abnormal   Collection Time: 02/15/19  6:03 PM  Result Value Ref Range   WBC 6.4 4.0 - 10.5 K/uL   RBC 3.99 (L) 4.22 - 5.81 MIL/uL   Hemoglobin 13.4 13.0 - 17.0 g/dL   HCT 40.7 39.0 - 52.0 %   MCV 102.0 (H) 80.0 - 100.0 fL   MCH 33.6 26.0 - 34.0 pg   MCHC 32.9 30.0 - 36.0 g/dL   RDW 14.6 11.5 - 15.5 %   Platelets 148 (L) 150 - 400 K/uL   nRBC 0.0 0.0 - 0.2 %   Neutrophils Relative % 53 %   Neutro Abs  3.4 1.7 - 7.7 K/uL   Lymphocytes Relative 32 %   Lymphs Abs 2.0 0.7 - 4.0 K/uL   Monocytes Relative 13 %   Monocytes Absolute 0.8 0.1 - 1.0 K/uL   Eosinophils Relative 1 %   Eosinophils Absolute 0.1 0.0 - 0.5 K/uL   Basophils Relative 1 %   Basophils Absolute 0.0 0.0 - 0.1 K/uL   Immature Granulocytes 0 %   Abs Immature Granulocytes 0.02 0.00 - 0.07 K/uL    Comment: Performed at [facility], [address] [place], [place] [address]  Brain natriuretic peptide     Status: Abnormal   Collection Time: 02/15/19  6:03 PM  Result Value Ref Range   B Natriuretic Peptide 3,586.0 (H) 0.0 - 100.0 pg/mL    Comment: Performed at [facility], [address] [place], [place] [address]  Urinalysis, Complete w Microscopic     Status: Abnormal   Collection Time: 02/15/19  7:53 PM  Result Value Ref Range   Color, Urine YELLOW (A) YELLOW   APPearance CLOUDY (A) CLEAR   Specific Gravity, Urine 1.012 1.005 - 1.030   pH 5.0 5.0 - 8.0   Glucose, UA NEGATIVE NEGATIVE mg/dL   Hgb urine dipstick MODERATE (A)  NEGATIVE   Bilirubin Urine NEGATIVE NEGATIVE   Ketones, ur NEGATIVE NEGATIVE mg/dL   Protein, ur 100 (A) NEGATIVE mg/dL   Nitrite POSITIVE (A) NEGATIVE   Leukocytes,Ua LARGE (A) NEGATIVE   RBC / HPF 11-20 0 - 5 RBC/hpf   WBC, UA >50 (H) 0 - 5 WBC/hpf   Bacteria, UA FEW (A) NONE SEEN   Squamous Epithelial / LPF 0-5 0 - 5   WBC Clumps PRESENT    Mucus PRESENT    Hyaline Casts, UA PRESENT     Comment: Performed at [facility], [address] [place], [place] [address]  Basic metabolic panel     Status: Abnormal   Collection Time: 02/16/19  4:09 AM  Result Value Ref Range   Sodium 138 135 - 145 mmol/L   Potassium 4.4 3.5 - 5.1 mmol/L   Chloride 108 98 - 111 mmol/L   CO2 20 (L) 22 - 32 mmol/L   Glucose, Bld 115 (H) 70 - 99 mg/dL   BUN 13 6 - 20 mg/dL   Creatinine, Ser 1.20 0.61 - 1.24 mg/dL   Calcium 8.1 (L) 8.9 - 10.3 mg/dL   GFR calc non Af Amer >60 >60 mL/min   GFR calc Af Amer >60 >60 mL/min   Anion gap 10 5 - 15    Comment: Performed at [facility], [address] [place],  [address]    Dg Chest 2 View  Result Date: 02/15/2019 CLINICAL DATA:  Increased edema starting 3 weeks ago. Shortness of breath starting 3 days ago. EXAM: CHEST - 2 VIEW COMPARISON:  Chest x-ray dated 07/06/2014. FINDINGS: Mild cardiomegaly. Central pulmonary vascular congestion and bibasilar opacities. Probable small bilateral pleural effusions. No pneumothorax seen. No acute appearing osseous abnormality. IMPRESSION: 1. Cardiomegaly with central pulmonary vascular congestion suggesting mild CHF/volume overload. 2. Bibasilar opacities are likely a combination of atelectasis and small pleural effusions. Electronically Signed   By: Franki Cabot M.D.   On: 02/15/2019 19:05    Review of Systems  Constitutional: Positive for diaphoresis and malaise/fatigue.  HENT: Positive for congestion.   Eyes: Negative.   Respiratory: Positive for cough and shortness of breath.    Cardiovascular: Positive for chest pain, palpitations, orthopnea, leg swelling  and PND.  Gastrointestinal: Positive for heartburn.  Genitourinary: Negative.   Musculoskeletal: Positive for myalgias.  Skin: Negative.   Neurological: Positive for weakness.  Endo/Heme/Allergies: Negative.    Blood pressure 105/89, pulse (!) 127, temperature 98.2 F (36.8 C), temperature source Oral, resp. rate 20, height 5\' 8"  (1.727 m), weight 90.8 kg, SpO2 97 %. Physical Exam  Nursing note and vitals reviewed. Constitutional: He is oriented to person, place, and time. He appears well-developed and well-nourished.  HENT:  Head: Normocephalic and atraumatic.  Eyes: Pupils are equal, round, and reactive to light. Conjunctivae and EOM are normal.  Neck: Normal range of motion. Neck supple.  Cardiovascular: Normal rate, regular rhythm and normal heart sounds.  Respiratory: Effort normal and breath sounds normal.  GI: Soft. Bowel sounds are normal. He exhibits distension.  Musculoskeletal: Normal range of motion.        General: Edema present.  Neurological: He is alert and oriented to person, place, and time. He has normal reflexes.  Skin: Skin is warm and dry.  Psychiatric: He has a normal mood and affect.    Assessment/Plan: Congestive heart failure Shortness of breath Tachycardia possibly atrial flutter versus atrial tachycardia Leg edema History of gastric ulcer Mild obesity Smoking History of alcohol abuse History of DVT Bronchospasm Hypotension . Plan Agree with admit to telemetry Recommend rule out for myocardial infarction Recommend diuretic therapy with intravenous Lasix Advised patient refrain from alcohol abuse Refrain from tobacco abuse Recommend short-term anticoagulation therapy Echocardiogram was helpful for assessment of the left ventricular function Recommend amiodarone therapy for tachycardia Hypertension difficult to control we will hold some of the blood pressure  medication and heart failure medication     D  02/16/2019, 11:22 PM

## 2019-02-16 NOTE — Plan of Care (Signed)
Nutrition Education Note  RD consulted for nutrition education regarding new onset CHF.   58 year old male with PMHx of gastric ulcer, hx DVT admitted with acute CHF, A-fib RVR. Patient underwent echocardiogram this AM still pending results.  Met with patient at bedside. He reports he has a good appetite and intake at baseline. He has not yet received his breakfast tray today but feels he will be able to eat well. He reports he typically eats a fairly high sodium diet. He prepares meals himself. He eats canned foods, deli meat, other processed meat, and also eats high-sodium foods at restaurants. He does not currently have a scale at home.  RD provided "Low Sodium Nutrition Therapy" handout from the Academy of [employer]. Reviewed patient's dietary recall. Provided examples on ways to decrease sodium intake in diet. Discouraged intake of processed foods and use of salt shaker. Encouraged fresh fruits and vegetables as well as whole grain sources of carbohydrates to maximize fiber intake.   RD discussed why it is important for patient to adhere to diet recommendations, and emphasized the role of fluids, foods to avoid, and importance of weighing self daily. Teach back method used.  Expect fair to good compliance.  Body mass index is 30.44 kg/m. Pt meets criteria for obesity class I based on current BMI.  Current diet order is heart healthy. Labs and medications reviewed. No further nutrition interventions warranted at this time. RD contact information provided. If additional nutrition issues arise, please re-consult RD.   Willey Blade, MS, [place], LDN Office: [phone] Pager: [phone] After Hours/Weekend Pager: [phone]

## 2019-02-16 NOTE — Progress Notes (Signed)
*  PRELIMINARY RESULTS* Echocardiogram 2D Echocardiogram has been performed.  Ricky Ray 02/16/2019, 9:01 AM

## 2019-02-17 ENCOUNTER — Inpatient Hospital Stay: Payer: No Typology Code available for payment source

## 2019-02-17 ENCOUNTER — Inpatient Hospital Stay
Admit: 2019-02-17 | Discharge: 2019-02-17 | Disposition: A | Discharge status: Home / Self Care | Payer: No Typology Code available for payment source | Attending: Internal Medicine | Admitting: Internal Medicine

## 2019-02-17 ENCOUNTER — Encounter
Admission: EM | Disposition: A | Discharge status: Home Health | Payer: Self-pay | Source: Home / Self Care | Attending: Family Medicine

## 2019-02-17 HISTORY — PX: TEE WITHOUT CARDIOVERSION: SHX5443

## 2019-02-17 LAB — BASIC METABOLIC PANEL
Anion gap: 8 (ref 5–15)
BUN: 16 mg/dL (ref 6–20)
CO2: 21 mmol/L — AB (ref 22–32)
Calcium: 7.5 mg/dL — ABNORMAL LOW (ref 8.9–10.3)
Chloride: 106 mmol/L (ref 98–111)
Creatinine, Ser: 1.18 mg/dL (ref 0.61–1.24)
GFR calc Af Amer: >60 mL/min (ref >60)
GFR calc non Af Amer: >60 mL/min (ref >60)
Glucose, Bld: 134 mg/dL — ABNORMAL HIGH (ref 70–99)
Potassium: 3.8 mmol/L (ref 3.5–5.1)
Sodium: 135 mmol/L (ref 135–145)

## 2019-02-17 SURGERY — ECHOCARDIOGRAM, TRANSESOPHAGEAL
Anesthesia: Moderate Sedation

## 2019-02-17 MED ORDER — FENTANYL CITRATE (PF) 100 MCG/2ML IJ SOLN
INTRAMUSCULAR | Status: AC
  Filled 2019-02-17: qty 2

## 2019-02-17 MED ORDER — LIDOCAINE VISCOUS HCL 2 % MT SOLN
OROMUCOSAL | Status: AC | PRN
  Administered 2019-02-17: 15 mL via OROMUCOSAL

## 2019-02-17 MED ORDER — BUTAMBEN-TETRACAINE-BENZOCAINE 2-2-14 % EX AERO
INHALATION_SPRAY | CUTANEOUS | Status: AC
  Filled 2019-02-17: qty 5

## 2019-02-17 MED ORDER — MIDAZOLAM HCL 2 MG/2ML IJ SOLN
INTRAMUSCULAR | Status: AC | PRN
  Administered 2019-02-17: 1 mg via INTRAVENOUS
  Administered 2019-02-17: 2 mg via INTRAVENOUS

## 2019-02-17 MED ORDER — ENOXAPARIN SODIUM 100 MG/ML ~~LOC~~ SOLN
1.0000 mg/kg | Freq: Two times a day (BID) | SUBCUTANEOUS | Status: DC
  Administered 2019-02-17 – 2019-02-18 (×3): 95 mg via SUBCUTANEOUS
  Filled 2019-02-17 (×4): qty 1

## 2019-02-17 MED ORDER — FENTANYL CITRATE (PF) 100 MCG/2ML IJ SOLN
INTRAMUSCULAR | Status: AC | PRN
  Administered 2019-02-17: 50 ug via INTRAVENOUS

## 2019-02-17 MED ORDER — MIDAZOLAM HCL 5 MG/5ML IJ SOLN
INTRAMUSCULAR | Status: AC
  Filled 2019-02-17: qty 5

## 2019-02-17 MED ORDER — BUTAMBEN-TETRACAINE-BENZOCAINE 2-2-14 % EX AERO
INHALATION_SPRAY | CUTANEOUS | Status: AC | PRN
  Administered 2019-02-17: 4 via TOPICAL

## 2019-02-17 MED ORDER — SODIUM CHLORIDE 0.9 % IV SOLN: INTRAVENOUS | Status: DC

## 2019-02-17 MED ORDER — LIDOCAINE VISCOUS HCL 2 % MT SOLN
OROMUCOSAL | Status: AC
  Filled 2019-02-17: qty 15

## 2019-02-17 MED ORDER — SODIUM CHLORIDE FLUSH 0.9 % IV SOLN
INTRAVENOUS | Status: AC
  Filled 2019-02-17: qty 10

## 2019-02-17 NOTE — CV Procedure (Signed)
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 The patient and or family understand the risks of the procedure including the rare risks of death, stroke, heart attack, esophogeal perforation, sore throat, and reaction to medications given.  Moderate sedation During this procedure the patient has received Versed 4 milligrams and fentanyl 50 micrograms to achieve appropriate moderate sedation.  The patient had continued monitoring of heart rate, oxygenation, blood pressure, respiratory rate, and extent of signs of sedation throughout the entire procedure.  The patient received this moderate sedation over a period of 20 minutes.  Both the nursing staff and I were present during the procedure when the patient had moderate sedation for 100% of the time.  Treatment considerations Further continuation of treatment of LV systolic dysfunction with congestive heart failure with monitoring small RV mass stable at this time  For further details of transesophageal echocardiogram please refer to final report.  Signed,  Corey Skains M.D. [facility] 02/17/2019 1:47 PM

## 2019-02-17 NOTE — Progress Notes (Addendum)
[place] at [place] NAME: Ricky Ray    MR#:  [id number]  DATE OF BIRTH:  04-Apr-1961  SUBJECTIVE:  CHIEF COMPLAINT:   Chief Complaint  Patient presents with  . Shortness of Breath  . Leg Swelling   The patient has better shortness of breath and cough, leg swelling up to abdomen, oxygen by nasal cannula 3 L.  Still tachycardia at 120s.  On amiodarone drip. REVIEW OF SYSTEMS:  Review of Systems  Constitutional: Positive for malaise/fatigue. Negative for chills and fever.  HENT: Negative for sore throat.   Eyes: Negative for blurred vision and double vision.  Respiratory: Positive for cough and shortness of breath. Negative for hemoptysis, sputum production, wheezing and stridor.   Cardiovascular: Positive for leg swelling. Negative for chest pain, palpitations and orthopnea.  Gastrointestinal: Negative for abdominal pain, blood in stool, diarrhea, melena, nausea and vomiting.  Genitourinary: Negative for dysuria, flank pain and hematuria.  Musculoskeletal: Negative for back pain and joint pain.  Neurological: Negative for dizziness, sensory change, focal weakness, seizures, loss of consciousness, weakness and headaches.  Endo/Heme/Allergies: Negative for polydipsia.  Psychiatric/Behavioral: Negative for depression. The patient is not nervous/anxious.     DRUG ALLERGIES:  No Known Allergies VITALS:  Blood pressure 106/78, pulse (!) 121, temperature 99.1 F (37.3 C), temperature source Oral, resp. rate (!) 22, height 5\' 8"  (1.727 m), weight 94.7 kg, SpO2 95 %. PHYSICAL EXAMINATION:  Physical Exam HENT:     Head: Normocephalic.     Mouth/Throat:     Mouth: Mucous membranes are moist.  Eyes:     General: No scleral icterus.    Conjunctiva/sclera: Conjunctivae normal.     Pupils: Pupils are equal, round, and reactive to light.  Neck:     Musculoskeletal: Normal range of motion and neck supple.     Vascular: No JVD.     Trachea: No  tracheal deviation.  Cardiovascular:     Rate and Rhythm: Normal rate and regular rhythm.     Heart sounds: Normal heart sounds. No murmur. No gallop.   Pulmonary:     Effort: Pulmonary effort is normal. No respiratory distress.     Breath sounds: Rales present. No wheezing.  Abdominal:     General: Bowel sounds are normal. There is no distension.     Palpations: Abdomen is soft.     Tenderness: There is no abdominal tenderness. There is no rebound.  Genitourinary:    Comments: Scrotal swelling. Musculoskeletal: Normal range of motion.        General: No tenderness.     Right lower leg: Edema present.     Left lower leg: Edema present.     Comments: Bilateral leg swelling up to thighs and abdomen  Skin:    Findings: No erythema or rash.  Neurological:     Mental Status: He is alert and oriented to person, place, and time.     Cranial Nerves: No cranial nerve deficit.  Psychiatric:        Mood and Affect: Mood normal.    LABORATORY PANEL:  Male CBC Recent Labs  Lab 02/15/19 1803  WBC 6.4  HGB 13.4  HCT 40.7  PLT 148*   ------------------------------------------------------------------------------------------------------------------ Chemistries  Recent Labs  Lab 02/15/19 1803  02/17/19 0403  NA 138   < > 135  K 4.0   < > 3.8  CL 107   < > 106  CO2 23   < > 21*  GLUCOSE 141*   < > 134*  BUN 12   < > 16  CREATININE 1.16   < > 1.18  CALCIUM 8.1*   < > 7.5*  AST 18  --   --   ALT 10  --   --   ALKPHOS 53  --   --   BILITOT 1.2  --   --    < > = values in this interval not displayed.   RADIOLOGY:  US Abdomen Complete  Result Date: 02/17/2019 CLINICAL DATA:  Abdominal distention. EXAM: ABDOMEN ULTRASOUND COMPLETE COMPARISON:  CT abdomen pelvis dated July 06, 2014. FINDINGS: Gallbladder: The gallbladder is contracted. There is diffuse wall thickening. No gallstones. No sonographic Murphy sign noted by sonographer. Common bile duct: Diameter: 5 mm, normal. Liver:  No focal lesion identified. Mildly increased parenchymal echogenicity. Portal vein is patent on color Doppler imaging with normal direction of blood flow towards the liver. Distention of the intrahepatic veins. IVC: Mildly distended. Pancreas: Visualized portion unremarkable. Spleen: Size and appearance within normal limits. Right Kidney: Length: 10.9 cm. Echogenicity within normal limits. No mass or hydronephrosis visualized. 1.0 cm simple cyst in the upper pole. Left Kidney: Length: 11.4 cm. Echogenicity within normal limits. No mass or hydronephrosis visualized. Abdominal aorta: No aneurysm visualized. Other findings: Small to moderate ascites. IMPRESSION: 1. Small to moderate ascites. 2. Distended IVC and intrahepatic veins may reflect underlying right heart dysfunction. 3. Diffuse gallbladder wall thickening is likely related to contracted state as well as ascites and heart failure. Correlate with any right upper quadrant symptoms. 4. Hepatic steatosis. Electronically Signed   By: Titus Dubin M.D.   On: 02/17/2019 09:01   ASSESSMENT AND PLAN:   Patient is a 58 year old white male with no known medical history of congestive heart failure presents with shortness of breath and swelling of the lower extremity  1.    Acute respiratory failure with hypoxia due to acute systolic CHF. Try to wean off oxygen, i.e. be PRN,  continue low-dose IV Lasix due to low side blood pressure.  Continue Midodrine. TEE: Severe LV systolic dysfunction with heart failure and small pedunculated mass of 2 cm 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 Nehemiah Massed.  2.  A. fib/a flutter with rapid ventricular rate;  Continue amiodarone drip and Lovenox.  3.  Bronchospasm, Xopenex nebs  4.  Nicotine abuse smoking cessation provided 4 minutes spent strongly recommend patient stop smoking, nicotine patch offered patient does not want a nicotine patch  UTI: on rocephin.  Follow urine culture. Abdominal distention with  Small to moderate ascites per [place]. Continue Lasix.  All the records are reviewed and case discussed with Care Management/Social Worker. Management plans discussed with the patient, family and they are in agreement.  CODE STATUS: Full Code  TOTAL TIME TAKING CARE OF THIS PATIENT: 33 minutes.   More than 50% of the time was spent in counseling/coordination of care: YES  POSSIBLE D/C IN 3 DAYS, DEPENDING ON CLINICAL CONDITION.   Demetrios Loll M.D on 02/17/2019 at 2:27 PM  Between 7am to 6pm - Pager - [phone]  After 6pm go to [email or web address] - [facility]

## 2019-02-17 NOTE — Progress Notes (Signed)
*  PRELIMINARY RESULTS* Echocardiogram Echocardiogram Transesophageal has been performed.  Sherrie Sport 02/17/2019, 1:44 PM

## 2019-02-17 NOTE — TOC Initial Note (Signed)
Transition of Care [facility]) - Initial/Assessment Note    Patient Details  Name: Ricky Ray MRN: [id number] Date of Birth: 02/01/1961  Transition of Care [facility]) CM/SW Contact:    Ricky Rafter, RN Phone Number: 02/17/2019, 3:29 PM  Clinical Narrative:   Patient is from home alone.  Admitted with swelling and and SOB; acute CHF and atrial fibrillation.  He is a [facility] patient and is not interested in transferring. Ricky Ray with the [place] and notified her that patient is here and would like to stay here.  He gets medications via the mail from the [place] and will use [facility] or [facility] drug for any immediate medications.  Patient does not have a functioning scale at home.   Has a HF clinic appointment scheduled.   Currently on 3L acute oxygen.  Does not drive; his sister transports him. Will continue to follow and assist with discharge planning as patient progresses.          Expected Discharge Plan: Home/Self Care Barriers to Discharge: Continued Medical Work up   Patient Goals and CMS Choice        Expected Discharge Plan and Services Expected Discharge Plan: Home/Self Care   Discharge Planning Services: HF Clinic, CM Consult   Living arrangements for the past 2 months: Mobile Home                          Prior Living Arrangements/Services Living arrangements for the past 2 months: Mobile Home Lives with:: Self   Do you feel safe going back to the place where you live?: Yes               Activities of Daily Living Home Assistive Devices/Equipment: None ADL Screening (condition at time of admission) Patient's cognitive ability adequate to safely complete daily activities?: Yes 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) Does the patient have difficulty seeing, even when wearing glasses/contacts?: Yes(reading glasses) Does the patient have difficulty concentrating, remembering, or making decisions?: No Patient able to express need for assistance  with ADLs?: Yes Does the patient have difficulty dressing or bathing?: Yes Independently performs ADLs?: Yes (appropriate for developmental age) Does the patient have difficulty walking or climbing stairs?: Yes Weakness of Legs: Both Weakness of Arms/Hands: Both  Permission Sought/Granted                  Emotional Assessment Appearance:: Appears older than stated age, Disheveled Attitude/Demeanor/Rapport: Gracious Affect (typically observed): Accepting Orientation: : Oriented to Self, Oriented to Place, Oriented to  Time, Oriented to Situation      Admission diagnosis:  Atrial flutter, unspecified type ([place]) [I48.92] Congestive heart failure, unspecified HF chronicity, unspecified heart failure type [facility]) [I50.9] Patient Active Problem List   Diagnosis Date Noted  . Acute CHF ([place]) 02/15/2019   PCP:  Patient, No Pcp Per Pharmacy:   [facility], [address] [place] [address] Phone: [phone] Fax: [phone]     Social Determinants of Health (SDOH) Interventions    Readmission Risk Interventions No flowsheet data found.

## 2019-02-17 NOTE — Progress Notes (Signed)
pts BP low / 90/74- pt asymptomatic/ evening dose of lasix held/ MD made aware

## 2019-02-18 ENCOUNTER — Inpatient Hospital Stay: Payer: No Typology Code available for payment source

## 2019-02-18 ENCOUNTER — Encounter: Payer: Self-pay | Admitting: *Deleted

## 2019-02-18 LAB — HIV ANTIBODY (ROUTINE TESTING W REFLEX): HIV Screen 4th Generation wRfx: NONREACTIVE

## 2019-02-18 LAB — BASIC METABOLIC PANEL
Anion gap: 9 (ref 5–15)
BUN: 17 mg/dL (ref 6–20)
CO2: 18 mmol/L — ABNORMAL LOW (ref 22–32)
CREATININE: 1.22 mg/dL (ref 0.61–1.24)
Calcium: 7.4 mg/dL — ABNORMAL LOW (ref 8.9–10.3)
Chloride: 105 mmol/L (ref 98–111)
GFR calc Af Amer: >60 mL/min (ref >60)
GFR calc non Af Amer: >60 mL/min (ref >60)
Glucose, Bld: 119 mg/dL — ABNORMAL HIGH (ref 70–99)
Potassium: 4.6 mmol/L (ref 3.5–5.1)
Sodium: 132 mmol/L — ABNORMAL LOW (ref 135–145)

## 2019-02-18 LAB — MAGNESIUM: Magnesium: 1.7 mg/dL (ref 1.7–2.4)

## 2019-02-18 MED ORDER — TAMSULOSIN HCL 0.4 MG PO CAPS
0.4000 mg | ORAL_CAPSULE | Freq: Every day | ORAL | Status: DC
  Administered 2019-02-18 – 2019-02-28 (×11): 0.4 mg via ORAL
  Filled 2019-02-18 (×11): qty 1

## 2019-02-18 MED ORDER — TRAMADOL HCL 50 MG PO TABS
50.0000 mg | ORAL_TABLET | Freq: Four times a day (QID) | ORAL | Status: DC | PRN
  Administered 2019-02-18 – 2019-02-27 (×24): 50 mg via ORAL
  Filled 2019-02-18 (×25): qty 1

## 2019-02-18 MED ORDER — DIGOXIN 0.25 MG/ML IJ SOLN
0.5000 mg | Freq: Once | INTRAMUSCULAR | Status: AC
  Administered 2019-02-18: 0.5 mg via INTRAVENOUS
  Filled 2019-02-18: qty 2

## 2019-02-18 NOTE — Progress Notes (Signed)
Pt complains of pain in left arm not relieved with prn tylenol.Pt also has low urine output. Bladder scan shows 533 in bladder. MD orders in and out cath and RN suggest flomax.  I will await any new orders. I will continue to assess.

## 2019-02-18 NOTE — Progress Notes (Signed)
Pt shows 500 ml in urinal. Bladder scan also shows >500. Pt urinates>200 while in room. Flomax was started. Next RN will continue to assess.

## 2019-02-18 NOTE — Progress Notes (Signed)
Pt's HR has been in the 120's about all night, and BP still on the lower end (95/81) this morning. Dr. Marcille Blanco made aware, no new orders. Will continue to monitor. Conley Simmonds, RN, BSN

## 2019-02-18 NOTE — Progress Notes (Signed)
[place] at [place] NAME: Ricky Ray    MR#:  [id number]  DATE OF BIRTH:  1961/05/07  SUBJECTIVE:  CHIEF COMPLAINT:   Chief Complaint  Patient presents with  . Shortness of Breath  . Leg Swelling   The patient still has shortness of breath and cough, leg swelling up to abdomen, oxygen by nasal cannula 3 L.  Still tachycardia at 120s.  On amiodarone drip. REVIEW OF SYSTEMS:  Review of Systems  Constitutional: Positive for malaise/fatigue. Negative for chills and fever.  HENT: Negative for sore throat.   Eyes: Negative for blurred vision and double vision.  Respiratory: Positive for cough and shortness of breath. Negative for hemoptysis, sputum production, wheezing and stridor.   Cardiovascular: Positive for leg swelling. Negative for chest pain, palpitations and orthopnea.  Gastrointestinal: Negative for abdominal pain, blood in stool, diarrhea, melena, nausea and vomiting.  Genitourinary: Negative for dysuria, flank pain and hematuria.  Musculoskeletal: Negative for back pain and joint pain.  Neurological: Negative for dizziness, sensory change, focal weakness, seizures, loss of consciousness, weakness and headaches.  Endo/Heme/Allergies: Negative for polydipsia.  Psychiatric/Behavioral: Negative for depression. The patient is not nervous/anxious.     DRUG ALLERGIES:  No Known Allergies VITALS:  Blood pressure 102/80, pulse (!) 125, temperature 98.1 F (36.7 C), temperature source Oral, resp. rate 20, height 5\' 8"  (1.727 m), weight 93.7 kg, SpO2 95 %. PHYSICAL EXAMINATION:  Physical Exam Constitutional:      General: He is not in acute distress. HENT:     Head: Normocephalic.     Mouth/Throat:     Mouth: Mucous membranes are moist.  Eyes:     General: No scleral icterus.    Conjunctiva/sclera: Conjunctivae normal.     Pupils: Pupils are equal, round, and reactive to light.  Neck:     Musculoskeletal: Normal range of motion  and neck supple.     Vascular: No JVD.     Trachea: No tracheal deviation.  Cardiovascular:     Rate and Rhythm: Tachycardia present. Rhythm irregular.     Heart sounds: Normal heart sounds. No murmur. No gallop.   Pulmonary:     Effort: Pulmonary effort is normal. No respiratory distress.     Breath sounds: Rales present. No wheezing.  Abdominal:     General: Bowel sounds are normal. There is no distension.     Palpations: Abdomen is soft.     Tenderness: There is no abdominal tenderness. There is no rebound.  Genitourinary:    Comments: Scrotal swelling. Musculoskeletal: Normal range of motion.        General: Swelling present. No tenderness.     Right lower leg: Edema present.     Left lower leg: Edema present.     Comments: Bilateral leg swelling up to thighs and abdomen, Left arm swelling.  Skin:    Findings: No erythema or rash.  Neurological:     Mental Status: He is alert and oriented to person, place, and time.     Cranial Nerves: No cranial nerve deficit.  Psychiatric:        Mood and Affect: Mood normal.    LABORATORY PANEL:  Male CBC Recent Labs  Lab 02/15/19 1803  WBC 6.4  HGB 13.4  HCT 40.7  PLT 148*   ------------------------------------------------------------------------------------------------------------------ Chemistries  Recent Labs  Lab 02/15/19 1803  02/18/19 0253  NA 138   < > 132*  K 4.0   < > 4.6  CL 107   < > 105  CO2 23   < > 18*  GLUCOSE 141*   < > 119*  BUN 12   < > 17  CREATININE 1.16   < > 1.22  CALCIUM 8.1*   < > 7.4*  AST 18  --   --   ALT 10  --   --   ALKPHOS 53  --   --   BILITOT 1.2  --   --    < > = values in this interval not displayed.   RADIOLOGY:  No results found. ASSESSMENT AND PLAN:   Patient is a 58 year old white male with no known medical history of congestive heart failure presents with shortness of breath and swelling of the lower extremity  1.    Acute respiratory failure with hypoxia due to acute  systolic CHF,  ejection fraction of 20-25%. Try to wean off oxygen, i.e. be PRN,  continue low-dose IV Lasix due to low side blood pressure.  Continue Midodrine. Unable to start ACE inhibitor or beta-blocker due to low blood pressure.  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 Nehemiah Massed.  2.  A. fib/a flutter with rapid ventricular rate;  Continue amiodarone drip and Lovenox. IV digoxin and n.p.o. per Dr. Clayborn Bigness.  3.  Bronchospasm, Xopenex nebs  4.  Nicotine abuse smoking cessation provided 4 minutes spent strongly recommend patient stop smoking, nicotine patch offered patient does not want a nicotine patch  UTI: on rocephin.  Follow urine culture: ENTEROCOCCUS FAECALIS . Abdominal distention with Small to moderate ascites per [place]. Continue Lasix.  Left arm swelling.  Venous duplex.  Hyponatremia.  Follow-up BMP.  All the records are reviewed and case discussed with Care Management/Social Worker. Management plans discussed with the patient, family and they are in agreement.  CODE STATUS: Full Code  TOTAL TIME TAKING CARE OF THIS PATIENT: 32 minutes.   More than 50% of the time was spent in counseling/coordination of care: YES  POSSIBLE D/C IN 3 DAYS, DEPENDING ON CLINICAL CONDITION.   Demetrios Loll M.D on 02/18/2019 at 11:12 AM  Between 7am to 6pm - Pager - [phone]  After 6pm go to [email or web address] - [facility]

## 2019-02-19 LAB — CBC
HCT: 35.9 % — ABNORMAL LOW (ref 39.0–52.0)
HEMOGLOBIN: 12.1 g/dL — AB (ref 13.0–17.0)
MCH: 33.4 pg (ref 26.0–34.0)
MCHC: 33.7 g/dL (ref 30.0–36.0)
MCV: 99.2 fL (ref 80.0–100.0)
Platelets: 113 10*3/uL — ABNORMAL LOW (ref 150–400)
RBC: 3.62 MIL/uL — AB (ref 4.22–5.81)
RDW: 14.5 % (ref 11.5–15.5)
WBC: 4.8 10*3/uL (ref 4.0–10.5)
nRBC: 0 % (ref 0.0–0.2)

## 2019-02-19 LAB — URINE CULTURE

## 2019-02-19 LAB — BASIC METABOLIC PANEL
ANION GAP: 7 (ref 5–15)
BUN: 16 mg/dL (ref 6–20)
CO2: 23 mmol/L (ref 22–32)
Calcium: 7.2 mg/dL — ABNORMAL LOW (ref 8.9–10.3)
Chloride: 102 mmol/L (ref 98–111)
Creatinine, Ser: 1.11 mg/dL (ref 0.61–1.24)
GFR calc Af Amer: >60 mL/min (ref >60)
GFR calc non Af Amer: >60 mL/min (ref >60)
Glucose, Bld: 102 mg/dL — ABNORMAL HIGH (ref 70–99)
Potassium: 3.6 mmol/L (ref 3.5–5.1)
Sodium: 132 mmol/L — ABNORMAL LOW (ref 135–145)

## 2019-02-19 LAB — ECHOCARDIOGRAM COMPLETE
Height: 68 in
Weight: 3203.2 oz

## 2019-02-19 MED ORDER — ENOXAPARIN SODIUM 100 MG/ML ~~LOC~~ SOLN
1.0000 mg/kg | Freq: Two times a day (BID) | SUBCUTANEOUS | Status: DC
  Administered 2019-02-19 – 2019-02-21 (×5): 100 mg via SUBCUTANEOUS
  Filled 2019-02-19 (×6): qty 1

## 2019-02-19 MED ORDER — AMIODARONE HCL 200 MG PO TABS
200.0000 mg | ORAL_TABLET | Freq: Every day | ORAL | Status: DC
  Administered 2019-02-19 – 2019-02-23 (×5): 200 mg via ORAL
  Filled 2019-02-19 (×5): qty 1

## 2019-02-19 MED ORDER — AMOXICILLIN 500 MG PO CAPS
500.0000 mg | ORAL_CAPSULE | Freq: Three times a day (TID) | ORAL | Status: DC
  Administered 2019-02-19 – 2019-02-24 (×15): 500 mg via ORAL
  Filled 2019-02-19 (×18): qty 1

## 2019-02-19 MED ORDER — MAGNESIUM SULFATE 2 GM/50ML IV SOLN
2.0000 g | Freq: Once | INTRAVENOUS | Status: AC
  Administered 2019-02-19: 2 g via INTRAVENOUS
  Filled 2019-02-19: qty 50

## 2019-02-19 NOTE — Progress Notes (Signed)
[place] at [place] NAME: Ricky Ray    MR#:  [id number]  DATE OF BIRTH:  17-Feb-1961  SUBJECTIVE:  CHIEF COMPLAINT:   Chief Complaint  Patient presents with  . Shortness of Breath  . Leg Swelling   The patient still has shortness of breath and cough, leg swelling up to abdomen, oxygen by nasal cannula 3 L.  Tachycardia improved.  On amiodarone drip. REVIEW OF SYSTEMS:  Review of Systems  Constitutional: Positive for malaise/fatigue. Negative for chills and fever.  HENT: Negative for sore throat.   Eyes: Negative for blurred vision and double vision.  Respiratory: Positive for cough and shortness of breath. Negative for hemoptysis, sputum production, wheezing and stridor.   Cardiovascular: Positive for leg swelling. Negative for chest pain, palpitations and orthopnea.  Gastrointestinal: Negative for abdominal pain, blood in stool, diarrhea, melena, nausea and vomiting.  Genitourinary: Negative for dysuria, flank pain and hematuria.  Musculoskeletal: Negative for back pain and joint pain.  Skin: Negative for rash.  Neurological: Negative for dizziness, sensory change, focal weakness, seizures, loss of consciousness, weakness and headaches.  Endo/Heme/Allergies: Negative for polydipsia.  Psychiatric/Behavioral: Negative for depression. The patient is not nervous/anxious.     DRUG ALLERGIES:  No Known Allergies VITALS:  Blood pressure 94/62, pulse 72, temperature 97.8 F (36.6 C), temperature source Oral, resp. rate 16, height 5\' 8"  (1.727 m), weight 100.7 kg, SpO2 97 %. PHYSICAL EXAMINATION:  Physical Exam Constitutional:      General: He is not in acute distress. HENT:     Head: Normocephalic.     Mouth/Throat:     Mouth: Mucous membranes are moist.  Eyes:     General: No scleral icterus.    Conjunctiva/sclera: Conjunctivae normal.     Pupils: Pupils are equal, round, and reactive to light.  Neck:     Musculoskeletal: Normal  range of motion and neck supple.     Vascular: No JVD.     Trachea: No tracheal deviation.  Cardiovascular:     Rate and Rhythm: Normal rate and regular rhythm.     Heart sounds: Normal heart sounds. No murmur. No gallop.   Pulmonary:     Effort: Pulmonary effort is normal. No respiratory distress.     Breath sounds: Rales present. No wheezing.  Abdominal:     General: Bowel sounds are normal. There is no distension.     Palpations: Abdomen is soft.     Tenderness: There is no abdominal tenderness. There is no rebound.  Genitourinary:    Comments: Scrotal swelling. Musculoskeletal: Normal range of motion.        General: Swelling present. No tenderness.     Right lower leg: Edema present.     Left lower leg: Edema present.     Comments: Bilateral leg swelling up to thighs and abdomen, Left arm swelling.  Skin:    Findings: No erythema or rash.  Neurological:     Mental Status: He is alert and oriented to person, place, and time.     Cranial Nerves: No cranial nerve deficit.  Psychiatric:        Mood and Affect: Mood normal.    LABORATORY PANEL:  Male CBC Recent Labs  Lab 02/19/19 0439  WBC 4.8  HGB 12.1*  HCT 35.9*  PLT 113*   ------------------------------------------------------------------------------------------------------------------ Chemistries  Recent Labs  Lab 02/15/19 1803  02/18/19 0253 02/19/19 0439  NA 138   < > 132* 132*  K  4.0   < > 4.6 3.6  CL 107   < > 105 102  CO2 23   < > 18* 23  GLUCOSE 141*   < > 119* 102*  BUN 12   < > 17 16  CREATININE 1.16   < > 1.22 1.11  CALCIUM 8.1*   < > 7.4* 7.2*  MG  --   --  1.7  --   AST 18  --   --   --   ALT 10  --   --   --   ALKPHOS 53  --   --   --   BILITOT 1.2  --   --   --    < > = values in this interval not displayed.   RADIOLOGY:  US Venous Img Upper Uni Left  Result Date: 02/18/2019 CLINICAL DATA:  Left arm pain and swelling EXAM: LEFT UPPER EXTREMITY VENOUS DOPPLER ULTRASOUND TECHNIQUE:  Gray-scale sonography with graded compression, as well as color Doppler and duplex ultrasound were performed to evaluate the upper extremity deep venous system from the level of the subclavian vein and including the jugular, axillary, basilic, radial, ulnar and upper cephalic vein. Spectral Doppler was utilized to evaluate flow at rest and with distal augmentation maneuvers. COMPARISON:  None. FINDINGS: Contralateral Subclavian Vein: Respiratory phasicity is normal and symmetric with the symptomatic side. No evidence of thrombus. Normal compressibility. Internal Jugular Vein: No evidence of thrombus. Normal compressibility, respiratory phasicity and response to augmentation. Subclavian Vein: No evidence of thrombus. Normal compressibility, respiratory phasicity and response to augmentation. Axillary Vein: No evidence of thrombus. Normal compressibility, respiratory phasicity and response to augmentation. Cephalic Vein: Left cephalic vein demonstrates intraluminal thrombus appearing occlusive throughout the upper arm, across the elbow into the forearm. Appearance compatible with superficial thrombosis/thrombophlebitis. This does not propagate into the deep veins. Basilic Vein: No evidence of thrombus. Normal compressibility, respiratory phasicity and response to augmentation. Brachial Veins: No evidence of thrombus. Normal compressibility, respiratory phasicity and response to augmentation. Radial Veins: No evidence of thrombus. Normal compressibility, respiratory phasicity and response to augmentation. Ulnar Veins: No evidence of thrombus. Normal compressibility, respiratory phasicity and response to augmentation. Venous Reflux:  Not assessed Other Findings: Complex septated cystic structure of the ventral aspect of the wrist along the radial side measures 3 x 1 x 1.3 cm, suspect superficial ganglion cyst. Small nonenlarged left cervical lymph nodes incidentally noted. IMPRESSION: Negative for significant left upper  extremity DVT. Left upper extremity superficial thrombosis/thrombophlebitis of the cephalic vein. Other findings as above. Electronically Signed   By: Jerilynn Mages.  Shick M.D.   On: 02/18/2019 14:34   ASSESSMENT AND PLAN:   Patient is a 58 year old white male with no known medical history of congestive heart failure presents with shortness of breath and swelling of the lower extremity  1.    Acute respiratory failure with hypoxia due to acute systolic CHF,  ejection fraction of 20-25%. Try to wean off oxygen, i.e. be PRN,  continue low-dose IV Lasix due to low side blood pressure.  Continue Midodrine. Unable to start ACE inhibitor or beta-blocker due to low blood pressure.  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 Nehemiah Massed. Dr. called will refer to CT surgery as outpatient.  2.  A. fib/a flutter with rapid ventricular rate; heart rate is better controlled. Discontinue amiodarone drip and digoxin, start amiodarone 200 mg p.o. twice daily  for 2 weeks then daily per Dr. Clayborn Bigness. Continue Lovenox.  3.  Bronchospasm, Xopenex nebs  4.  Nicotine abuse smoking cessation provided 4 minutes spent strongly recommend patient stop smoking, nicotine patch offered patient does not want a nicotine patch  UTI: He is on rocephin.  Follow urine culture: ENTEROCOCCUS FAECALIS . Change to Augmentin po.  Abdominal distention with Small to moderate ascites  per [place]. Continue Lasix.  Left arm swelling, Left upper extremity superficial thrombosis/thrombophlebitis of the cephalic vein per venous duplex.  Continue Lovenox.  Hyponatremia.  Follow-up BMP. I discussed with Dr. Clayborn Bigness. All the records are reviewed and case discussed with Care Management/Social Worker. Management plans discussed with the patient, family and they are in agreement.  CODE STATUS: Full Code  TOTAL TIME TAKING CARE OF THIS  PATIENT: 32 minutes.   More than 50% of the time was spent in counseling/coordination of care: YES  POSSIBLE D/C IN 3 DAYS, DEPENDING ON CLINICAL CONDITION.   Demetrios Loll M.D on 02/19/2019 at 10:45 AM  Between 7am to 6pm - Pager - [phone]  After 6pm go to [email or web address] - [facility]

## 2019-02-19 NOTE — TOC Progression Note (Signed)
Transition of Care [facility]) - Progression Note    Patient Details  Name: ASHLEY MONTMINY MRN: [id number] Date of Birth: [birth year]  Transition of Care [facility]) CM/SW Contact  Elza Rafter, RN Phone Number: 02/19/2019, 2:48 PM  Clinical Narrative:  Continues with SOB, cough, 3L oxygen acute.  On amiodarone drip.     Expected Discharge Plan: Home/Self Care Barriers to Discharge: Continued Medical Work up  Expected Discharge Plan and Services Expected Discharge Plan: Home/Self Care   Discharge Planning Services: HF Clinic, CM Consult   Living arrangements for the past 2 months: Mobile Home                           Social Determinants of Health (SDOH) Interventions    Readmission Risk Interventions No flowsheet data found.

## 2019-02-19 NOTE — Plan of Care (Signed)
Educated patient on importance of daily weights and sodium restrictions. Spoke with sister on the phone as well who states she is the main caregiver for patient. Discussed daily weights and sodium restrictions with her as well. Gave patient CHF packet. Patient refused EMMI videos at this time. Encouraged patient to watch videos before day of discharge.  Problem: Education: Goal: Ability to demonstrate management of disease process will improve Outcome: Progressing   Problem: Activity: Goal: Capacity to carry out activities will improve Outcome: Not Progressing   Patient encouraged OOB with assistance, refused at this time,stated that he wasn't feeling up to it.  Problem: Cardiac: Goal: Ability to achieve and maintain adequate cardiopulmonary perfusion will improve Outcome: Progressing   Patient was weaned from 3L acute to 1L.

## 2019-02-19 NOTE — Plan of Care (Addendum)
Left upper extremity doppler results noted on chart check.  Lovenox injection given earlier.  Dr Marcille Blanco on call made aware.  No new orders.

## 2019-02-20 LAB — DIGOXIN LEVEL: Digoxin Level: 1.1 ng/mL (ref 0.8–2.0)

## 2019-02-20 LAB — BASIC METABOLIC PANEL
Anion gap: 10 (ref 5–15)
BUN: 12 mg/dL (ref 6–20)
CO2: 25 mmol/L (ref 22–32)
Calcium: 7.4 mg/dL — ABNORMAL LOW (ref 8.9–10.3)
Chloride: 99 mmol/L (ref 98–111)
Creatinine, Ser: 0.83 mg/dL (ref 0.61–1.24)
GFR calc Af Amer: >60 mL/min (ref >60)
GLUCOSE: 100 mg/dL — AB (ref 70–99)
Potassium: 3.6 mmol/L (ref 3.5–5.1)
Sodium: 134 mmol/L — ABNORMAL LOW (ref 135–145)

## 2019-02-20 LAB — MAGNESIUM: Magnesium: 1.8 mg/dL (ref 1.7–2.4)

## 2019-02-20 MED ORDER — FUROSEMIDE 10 MG/ML IJ SOLN
20.0000 mg | Freq: Three times a day (TID) | INTRAMUSCULAR | Status: DC
  Administered 2019-02-20 – 2019-02-24 (×11): 20 mg via INTRAVENOUS
  Filled 2019-02-20 (×12): qty 2

## 2019-02-20 NOTE — Progress Notes (Signed)
Ricky Ray at [place] NAME: Ricky Ray    MR#:  [id number]  DATE OF BIRTH:  1960/11/28  SUBJECTIVE:  CHIEF COMPLAINT:   Chief Complaint  Patient presents with  . Shortness of Breath  . Leg Swelling   The patient has better shortness of breath and cough, still leg swelling up to abdomen, oxygen by nasal cannula 3 L.  Heart rate is controlled. REVIEW OF SYSTEMS:  Review of Systems  Constitutional: Positive for malaise/fatigue. Negative for chills and fever.  HENT: Negative for sore throat.   Eyes: Negative for blurred vision and double vision.  Respiratory: Positive for shortness of breath. Negative for cough, hemoptysis, sputum production, wheezing and stridor.   Cardiovascular: Positive for leg swelling. Negative for chest pain, palpitations and orthopnea.  Gastrointestinal: Negative for abdominal pain, blood in stool, diarrhea, melena, nausea and vomiting.  Genitourinary: Negative for dysuria, flank pain and hematuria.  Musculoskeletal: Negative for back pain and joint pain.  Skin: Negative for rash.  Neurological: Negative for dizziness, sensory change, focal weakness, seizures, loss of consciousness, weakness and headaches.  Endo/Heme/Allergies: Negative for polydipsia.  Psychiatric/Behavioral: Negative for depression. The patient is not nervous/anxious.     DRUG ALLERGIES:  No Known Allergies VITALS:  Blood pressure 101/69, pulse (!) 59, temperature 98.4 F (36.9 C), temperature source Oral, resp. rate 18, height 5\' 8"  (1.727 m), weight 94.2 kg, SpO2 96 %. PHYSICAL EXAMINATION:  Physical Exam Constitutional:      General: He is not in acute distress. HENT:     Head: Normocephalic.     Mouth/Throat:     Mouth: Mucous membranes are moist.  Eyes:     General: No scleral icterus.    Conjunctiva/sclera: Conjunctivae normal.     Pupils: Pupils are equal, round, and reactive to light.  Neck:     Musculoskeletal: Normal range of  motion and neck supple.     Vascular: No JVD.     Trachea: No tracheal deviation.  Cardiovascular:     Rate and Rhythm: Normal rate and regular rhythm.     Heart sounds: Normal heart sounds. No murmur. No gallop.   Pulmonary:     Effort: Pulmonary effort is normal. No respiratory distress.     Breath sounds: Rales present. No wheezing.  Abdominal:     General: Bowel sounds are normal. There is no distension.     Palpations: Abdomen is soft.     Tenderness: There is no abdominal tenderness. There is no rebound.  Genitourinary:    Comments: Scrotal swelling. Musculoskeletal: Normal range of motion.        General: Swelling present. No tenderness.     Right lower leg: Edema present.     Left lower leg: Edema present.     Comments: Bilateral leg swelling up to thighs and abdomen, Left arm swelling.  Skin:    Findings: No erythema or rash.  Neurological:     Mental Status: He is alert and oriented to person, place, and time.     Cranial Nerves: No cranial nerve deficit.  Psychiatric:        Mood and Affect: Mood normal.    LABORATORY PANEL:  Male CBC Recent Labs  Lab 02/19/19 0439  WBC 4.8  HGB 12.1*  HCT 35.9*  PLT 113*   ------------------------------------------------------------------------------------------------------------------ Chemistries  Recent Labs  Lab 02/15/19 1803  02/20/19 0445  NA 138   < > 134*  K 4.0   < >  3.6  CL 107   < > 99  CO2 23   < > 25  GLUCOSE 141*   < > 100*  BUN 12   < > 12  CREATININE 1.16   < > 0.83  CALCIUM 8.1*   < > 7.4*  MG  --    < > 1.8  AST 18  --   --   ALT 10  --   --   ALKPHOS 53  --   --   BILITOT 1.2  --   --    < > = values in this interval not displayed.   RADIOLOGY:  No results found. ASSESSMENT AND PLAN:   Patient is a 58 year old white male with no known medical history of congestive heart failure presents with shortness of breath and swelling of the lower extremity  1.    Acute respiratory failure with  hypoxia due to acute systolic CHF,  ejection fraction of 20-25%. Try to wean off oxygen, i.e. be PRN,  Increase low-dose IV Lasix every 8 hours, hold if low blood pressure.  Continue Midodrine. Unable to start ACE inhibitor or beta-blocker due to low blood pressure.  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 Nehemiah Massed. Dr. Clayborn Bigness will refer to CT surgery as outpatient.  2.  A. fib/a flutter with rapid ventricular rate; heart rate is better controlled. Discontinued amiodarone drip and digoxin, started amiodarone 200 mg p.o. twice daily for 2 weeks then daily per Dr. Clayborn Bigness. Continue Lovenox.  3.  Bronchospasm, improved, on Xopenex nebs  4.  Nicotine abuse smoking cessation provided 4 minutes spent strongly recommend patient stop smoking, nicotine patch offered patient does not want a nicotine patch  UTI: He is on rocephin.  Follow urine culture: ENTEROCOCCUS FAECALIS . Changed to Augmentin po.  Abdominal distention with Small to moderate ascites  per [place]. Continue Lasix.  Left arm swelling, Left upper extremity superficial thrombosis/thrombophlebitis of the cephalic vein per venous duplex.  Continue Lovenox.  Hyponatremia.  Improving, follow-up BMP. All the records are reviewed and case discussed with Care Management/Social Worker. Management plans discussed with the patient, his sister and they are in agreement.  CODE STATUS: Full Code  TOTAL TIME TAKING CARE OF THIS PATIENT: 38 minutes.   More than 50% of the time was spent in counseling/coordination of care: YES  POSSIBLE D/C IN 3 DAYS, DEPENDING ON CLINICAL CONDITION.   Demetrios Loll M.D on 02/20/2019 at 1:46 PM  Between 7am to 6pm - Pager - [phone]  After 6pm go to [email or web address] - [employer] [facility]

## 2019-02-20 NOTE — Plan of Care (Signed)
IV in right upper forearm taken out, looks like it had been infiltrated. Red & hard. Problem: Nutrition: Goal: Adequate nutrition will be maintained Outcome: Progressing   Problem: Coping: Goal: Level of anxiety will decrease Outcome: Progressing   Problem: Elimination: Goal: Will not experience complications related to urinary retention Outcome: Progressing   Problem: Pain Managment: Goal: General experience of comfort will improve Outcome: Progressing Note:  Complaints of pain once in arms- treated with tramadol once with relief   Problem: Education: Goal: Knowledge of General Education information will improve Description Including pain rating scale, medication(s)/side effects and non-pharmacologic comfort measures Outcome: Completed/Met

## 2019-02-20 NOTE — Progress Notes (Signed)
Talked to Dr. Rozann Lesches about patient's BP being soft at 93/63 he has scheduled Lasix 20mg  IV. Patient takes midodrine TID with meals, order to give IV lasix. RN will continue to monitor.

## 2019-02-21 LAB — BASIC METABOLIC PANEL
Anion gap: 8 (ref 5–15)
BUN: 9 mg/dL (ref 6–20)
CO2: 25 mmol/L (ref 22–32)
Calcium: 7.4 mg/dL — ABNORMAL LOW (ref 8.9–10.3)
Chloride: 98 mmol/L (ref 98–111)
Creatinine, Ser: 0.94 mg/dL (ref 0.61–1.24)
GFR calc Af Amer: >60 mL/min (ref >60)
GFR calc non Af Amer: >60 mL/min (ref >60)
Glucose, Bld: 111 mg/dL — ABNORMAL HIGH (ref 70–99)
Potassium: 3.3 mmol/L — ABNORMAL LOW (ref 3.5–5.1)
SODIUM: 131 mmol/L — AB (ref 135–145)

## 2019-02-21 LAB — MAGNESIUM: Magnesium: 1.7 mg/dL (ref 1.7–2.4)

## 2019-02-21 MED ORDER — POLYETHYLENE GLYCOL 3350 17 G PO PACK
17.0000 g | PACK | Freq: Every day | ORAL | Status: DC
  Administered 2019-02-21 – 2019-02-24 (×3): 17 g via ORAL
  Filled 2019-02-21 (×7): qty 1

## 2019-02-21 MED ORDER — APIXABAN 5 MG PO TABS
5.0000 mg | ORAL_TABLET | Freq: Two times a day (BID) | ORAL | Status: DC
  Administered 2019-02-21 – 2019-02-28 (×14): 5 mg via ORAL
  Filled 2019-02-21 (×14): qty 1

## 2019-02-21 MED ORDER — POTASSIUM CHLORIDE CRYS ER 20 MEQ PO TBCR
40.0000 meq | EXTENDED_RELEASE_TABLET | Freq: Every day | ORAL | Status: DC
  Administered 2019-02-21 – 2019-02-23 (×3): 40 meq via ORAL
  Filled 2019-02-21 (×3): qty 2

## 2019-02-21 MED ORDER — POLYETHYLENE GLYCOL 3350 17 G PO PACK
17.0000 g | PACK | Freq: Every day | ORAL | Status: DC | PRN
  Administered 2019-02-21: 17 g via ORAL

## 2019-02-21 MED ORDER — ENOXAPARIN SODIUM 100 MG/ML ~~LOC~~ SOLN
1.0000 mg/kg | Freq: Two times a day (BID) | SUBCUTANEOUS | Status: DC
  Filled 2019-02-21: qty 1

## 2019-02-21 MED ORDER — BISACODYL 5 MG PO TBEC
5.0000 mg | DELAYED_RELEASE_TABLET | Freq: Every day | ORAL | Status: DC | PRN
  Administered 2019-02-21: 5 mg via ORAL
  Filled 2019-02-21: qty 1

## 2019-02-21 MED ORDER — MAGNESIUM SULFATE 2 GM/50ML IV SOLN
2.0000 g | Freq: Once | INTRAVENOUS | Status: AC
  Administered 2019-02-21: 2 g via INTRAVENOUS
  Filled 2019-02-21: qty 50

## 2019-02-21 NOTE — Consult Note (Signed)
ANTICOAGULATION CONSULT NOTE - Initial Consult  Pharmacy Consult for apixaban Indication: new onset atrial fibrillation  Patient Measurements: Height: 5\' 8"  (172.7 cm) Weight: 207 lb 3.7 oz (94 kg) IBW/kg (Calculated) : 68.4  Vital Signs: Temp: 98.8 F (37.1 C) (03/27 0735) Temp Source: Oral (03/27 0735) BP: 100/81 (03/27 1458) Pulse Rate: 40 (03/27 0735)  Labs: Recent Labs    02/19/19 0439 02/20/19 0445 02/21/19 0604  HGB 12.1*  --   --   HCT 35.9*  --   --   PLT 113*  --   --   CREATININE 1.11 0.83 0.94    Estimated Creatinine Clearance: 95.2 mL/min (by C-G formula based on SCr of 0.94 mg/dL).   Medical History: Past Medical History:  Diagnosis Date  . DVT (deep venous thrombosis) ([place])   . Gastric ulcer   . History of ITP     Medications:  Scheduled:  . amiodarone  200 mg Oral Daily  . amoxicillin  500 mg Oral Q8H  . aspirin EC  81 mg Oral Daily  . enoxaparin (LOVENOX) injection  1 mg/kg Subcutaneous Q12H  . furosemide  20 mg Intravenous Q8H  . mouth rinse  15 mL Mouth Rinse BID  . midodrine  5 mg Oral TID WC  . polyethylene glycol  17 g Oral Daily  . potassium chloride  40 mEq Oral Daily  . sodium chloride flush  3 mL Intravenous Q12H  . tamsulosin  0.4 mg Oral Daily    Assessment: 58 YOM with new onset A. fib/a flutter with rapid ventricular rate. He was started on treatment-dose Lovenox until this point with last dose 0940 this morning. He has 0/3 dose reduction criteria for apixaban.   Goal of Therapy:  Monitor platelets by anticoagulation protocol: Yes   Plan:  ---begin apixaban 5 mg twice daily with first dose at 2200 ---Hgb, PLT trending down slightly, CBC in am  Dallie Piles, PharmD 02/21/2019,3:08 PM

## 2019-02-21 NOTE — Progress Notes (Addendum)
[place] at [place] NAME: Ricky Ray    MR#:  [id number]  DATE OF BIRTH:  02/12/1961  SUBJECTIVE:  CHIEF COMPLAINT:   Chief Complaint  Patient presents with  . Shortness of Breath  . Leg Swelling   The patient has better shortness of breath and cough, still leg swelling up to abdomen, stable oxygen by nasal cannula 3 L.  Hypoxia and 65% without oxygen and 85% on 3 L this morning.  Heart rate is controlled. REVIEW OF SYSTEMS:  Review of Systems  Constitutional: Positive for malaise/fatigue. Negative for chills and fever.  HENT: Negative for sore throat.   Eyes: Negative for blurred vision and double vision.  Respiratory: Positive for shortness of breath. Negative for cough, hemoptysis, sputum production, wheezing and stridor.   Cardiovascular: Positive for leg swelling. Negative for chest pain, palpitations and orthopnea.  Gastrointestinal: Negative for abdominal pain, blood in stool, diarrhea, melena, nausea and vomiting.  Genitourinary: Negative for dysuria, flank pain and hematuria.  Musculoskeletal: Negative for back pain and joint pain.  Skin: Negative for rash.  Neurological: Negative for dizziness, sensory change, focal weakness, seizures, loss of consciousness, weakness and headaches.  Endo/Heme/Allergies: Negative for polydipsia.  Psychiatric/Behavioral: Negative for depression. The patient is not nervous/anxious.     DRUG ALLERGIES:  No Known Allergies VITALS:  Blood pressure 101/63, pulse (!) 40, temperature 98.8 F (37.1 C), temperature source Oral, resp. rate 20, height 5\' 8"  (1.727 m), weight 94 kg, SpO2 (!) 85 %. PHYSICAL EXAMINATION:  Physical Exam Constitutional:      General: He is not in acute distress. HENT:     Head: Normocephalic.     Mouth/Throat:     Mouth: Mucous membranes are moist.  Eyes:     General: No scleral icterus.    Conjunctiva/sclera: Conjunctivae normal.     Pupils: Pupils are equal,  round, and reactive to light.  Neck:     Musculoskeletal: Normal range of motion and neck supple.     Vascular: No JVD.     Trachea: No tracheal deviation.  Cardiovascular:     Rate and Rhythm: Normal rate and regular rhythm.     Heart sounds: Normal heart sounds. No murmur. No gallop.   Pulmonary:     Effort: Pulmonary effort is normal. No respiratory distress.     Breath sounds: Rales present. No wheezing.  Abdominal:     General: Bowel sounds are normal. There is no distension.     Palpations: Abdomen is soft.     Tenderness: There is no abdominal tenderness. There is no rebound.  Genitourinary:    Comments: Scrotal swelling. Musculoskeletal: Normal range of motion.        General: Swelling present. No tenderness.     Right lower leg: Edema present.     Left lower leg: Edema present.     Comments: Bilateral leg swelling up to thighs and abdomen, Left arm swelling.  Skin:    Findings: No erythema or rash.  Neurological:     Mental Status: He is alert and oriented to person, place, and time.     Cranial Nerves: No cranial nerve deficit.  Psychiatric:        Mood and Affect: Mood normal.    LABORATORY PANEL:  Male CBC Recent Labs  Lab 02/19/19 0439  WBC 4.8  HGB 12.1*  HCT 35.9*  PLT 113*   ------------------------------------------------------------------------------------------------------------------ Chemistries  Recent Labs  Lab 02/15/19 1803  02/21/19  0604  NA 138   < > 131*  K 4.0   < > 3.3*  CL 107   < > 98  CO2 23   < > 25  GLUCOSE 141*   < > 111*  BUN 12   < > 9  CREATININE 1.16   < > 0.94  CALCIUM 8.1*   < > 7.4*  MG  --    < > 1.7  AST 18  --   --   ALT 10  --   --   ALKPHOS 53  --   --   BILITOT 1.2  --   --    < > = values in this interval not displayed.   RADIOLOGY:  No results found. ASSESSMENT AND PLAN:   Patient is a 58 year old white male with no known medical history of congestive heart failure presents with shortness of breath  and swelling of the lower extremity  1.    Acute respiratory failure with hypoxia due to acute systolic CHF,  ejection fraction of 20-25%. Try to wean off oxygen, i.e. be PRN,  Increased IV Lasix 20 mg to every 8 hours, hold if low blood pressure.  Continue Midodrine. Unable to start ACE inhibitor or beta-blocker due to low blood pressure.  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 Nehemiah Massed. Dr. Clayborn Bigness will refer to CT surgery as outpatient.  2.  A. fib/a flutter with rapid ventricular rate; heart rate is better controlled. Discontinued amiodarone drip and digoxin, started amiodarone 200 mg p.o. twice daily for 2 weeks then daily per Dr. Clayborn Bigness. He has been treated with Lovenox, start Eliquis.  3.  Bronchospasm, improved, on Xopenex nebs  4.  Nicotine abuse smoking cessation provided 4 minutes spent strongly recommend patient stop smoking, nicotine patch offered patient does not want a nicotine patch  UTI: He is on rocephin.  Follow urine culture: ENTEROCOCCUS FAECALIS . Changed to Augmentin po.  Abdominal distention with Small to moderate ascites  per [place]. Continue Lasix.  Left arm swelling, Left upper extremity superficial thrombosis/thrombophlebitis of the cephalic vein per venous duplex.   Hyponatremia. follow-up BMP. Hypokalemia.  Potassium supplement. For magnesium Mia.  Magnesium IV and follow-up level. All the records are reviewed and case discussed with Care Management/Social Worker. Management plans discussed with the patient, his sister and they are in agreement.  CODE STATUS: Full Code  TOTAL TIME TAKING CARE OF THIS PATIENT: 30 minutes.   More than 50% of the time was spent in counseling/coordination of care: YES  POSSIBLE D/C IN 3 DAYS, DEPENDING ON CLINICAL CONDITION.   Demetrios Loll M.D on 02/21/2019 at 2:51 PM  Between 7am to 6pm - Pager -  [phone]  After 6pm go to [email or web address] - [employer] [facility]

## 2019-02-22 LAB — BASIC METABOLIC PANEL
Anion gap: 9 (ref 5–15)
BUN: 10 mg/dL (ref 6–20)
CO2: 27 mmol/L (ref 22–32)
Calcium: 7.5 mg/dL — ABNORMAL LOW (ref 8.9–10.3)
Chloride: 98 mmol/L (ref 98–111)
Creatinine, Ser: 0.84 mg/dL (ref 0.61–1.24)
GFR calc non Af Amer: >60 mL/min (ref >60)
Glucose, Bld: 102 mg/dL — ABNORMAL HIGH (ref 70–99)
Potassium: 3.4 mmol/L — ABNORMAL LOW (ref 3.5–5.1)
SODIUM: 134 mmol/L — AB (ref 135–145)

## 2019-02-22 LAB — CBC
HCT: 36.1 % — ABNORMAL LOW (ref 39.0–52.0)
Hemoglobin: 12.4 g/dL — ABNORMAL LOW (ref 13.0–17.0)
MCH: 33 pg (ref 26.0–34.0)
MCHC: 34.3 g/dL (ref 30.0–36.0)
MCV: 96 fL (ref 80.0–100.0)
PLATELETS: 145 10*3/uL — AB (ref 150–400)
RBC: 3.76 MIL/uL — ABNORMAL LOW (ref 4.22–5.81)
RDW: 14.8 % (ref 11.5–15.5)
WBC: 4.1 10*3/uL (ref 4.0–10.5)
nRBC: 0 % (ref 0.0–0.2)

## 2019-02-22 LAB — MAGNESIUM: Magnesium: 1.8 mg/dL (ref 1.7–2.4)

## 2019-02-22 MED ORDER — LACTULOSE 10 GM/15ML PO SOLN
30.0000 g | Freq: Two times a day (BID) | ORAL | Status: AC
  Administered 2019-02-22: 30 g via ORAL
  Filled 2019-02-22 (×2): qty 60

## 2019-02-22 MED ORDER — POTASSIUM CHLORIDE 20 MEQ PO PACK: 40.0000 meq | PACK | Freq: Once | ORAL | Status: DC

## 2019-02-22 MED ORDER — MIDODRINE HCL 5 MG PO TABS
10.0000 mg | ORAL_TABLET | Freq: Three times a day (TID) | ORAL | Status: DC
  Administered 2019-02-22 – 2019-02-28 (×18): 10 mg via ORAL
  Filled 2019-02-22 (×17): qty 2

## 2019-02-22 NOTE — Plan of Care (Signed)
  Problem: Clinical Measurements: Goal: Ability to maintain clinical measurements within normal limits will improve Outcome: Progressing Goal: Cardiovascular complication will be avoided Outcome: Progressing   Problem: Pain Managment: Goal: General experience of comfort will improve Outcome: Progressing   Problem: Safety: Goal: Ability to remain free from injury will improve Outcome: Progressing   Problem: Skin Integrity: Goal: Risk for impaired skin integrity will decrease Outcome: Progressing   

## 2019-02-22 NOTE — Progress Notes (Addendum)
Ricky Ray at [place] NAME: Ricky Ray    MR#:  [id number]  DATE OF BIRTH:  [birth year]  SUBJECTIVE:  CHIEF COMPLAINT:   Chief Complaint  Patient presents with  . Shortness of Breath  . Leg Swelling   Patient without complaint, start lactulose, replete potassium, increase midodrine REVIEW OF SYSTEMS:  Review of Systems  Constitutional: Positive for malaise/fatigue. Negative for chills and fever.  HENT: Negative for sore throat.   Eyes: Negative for blurred vision and double vision.  Respiratory: Positive for shortness of breath. Negative for cough, hemoptysis, sputum production, wheezing and stridor.   Cardiovascular: Positive for leg swelling. Negative for chest pain, palpitations and orthopnea.  Gastrointestinal: Negative for abdominal pain, blood in stool, diarrhea, melena, nausea and vomiting.  Genitourinary: Negative for dysuria, flank pain and hematuria.  Musculoskeletal: Negative for back pain and joint pain.  Skin: Negative for rash.  Neurological: Negative for dizziness, sensory change, focal weakness, seizures, loss of consciousness, weakness and headaches.  Endo/Heme/Allergies: Negative for polydipsia.  Psychiatric/Behavioral: Negative for depression. The patient is not nervous/anxious.     DRUG ALLERGIES:  No Known Allergies VITALS:  Blood pressure 95/65, pulse 79, temperature 98.3 F (36.8 C), temperature source Oral, resp. rate 16, height 5\' 8"  (1.727 m), weight 97.2 kg, SpO2 94 %. PHYSICAL EXAMINATION:  Physical Exam Constitutional:      General: He is not in acute distress. HENT:     Head: Normocephalic.     Mouth/Throat:     Mouth: Mucous membranes are moist.  Eyes:     General: No scleral icterus.    Conjunctiva/sclera: Conjunctivae normal.     Pupils: Pupils are equal, round, and reactive to light.  Neck:     Musculoskeletal: Normal range of motion and neck supple.     Vascular: No JVD.     Trachea: No  tracheal deviation.  Cardiovascular:     Rate and Rhythm: Normal rate and regular rhythm.     Heart sounds: Normal heart sounds. No murmur. No gallop.   Pulmonary:     Effort: Pulmonary effort is normal. No respiratory distress.     Breath sounds: Rales present. No wheezing.  Abdominal:     General: Bowel sounds are normal. There is no distension.     Palpations: Abdomen is soft.     Tenderness: There is no abdominal tenderness. There is no rebound.  Genitourinary:    Comments: Scrotal swelling. Musculoskeletal: Normal range of motion.        General: Swelling present. No tenderness.     Right lower leg: Edema present.     Left lower leg: Edema present.     Comments: Bilateral leg swelling up to thighs and abdomen, Left arm swelling.  Skin:    Findings: No erythema or rash.  Neurological:     Mental Status: He is alert and oriented to person, place, and time.     Cranial Nerves: No cranial nerve deficit.  Psychiatric:        Mood and Affect: Mood normal.    LABORATORY PANEL:  Male CBC Recent Labs  Lab 02/22/19 0515  WBC 4.1  HGB 12.4*  HCT 36.1*  PLT 145*   ------------------------------------------------------------------------------------------------------------------ Chemistries  Recent Labs  Lab 02/15/19 1803  02/22/19 0515  NA 138   < > 134*  K 4.0   < > 3.4*  CL 107   < > 98  CO2 23   < > 27  GLUCOSE 141*   < > 102*  BUN 12   < > 10  CREATININE 1.16   < > 0.84  CALCIUM 8.1*   < > 7.5*  MG  --    < > 1.8  AST 18  --   --   ALT 10  --   --   ALKPHOS 53  --   --   BILITOT 1.2  --   --    < > = values in this interval not displayed.   RADIOLOGY:  No results found. ASSESSMENT AND PLAN:  Patient is a 58 year old white male with no known medical history of congestive heart failure presents with shortness of breath and swelling of the lower extremity  *Acute respiratory failure with hypoxia  due to acute systolic CHF exacerbation Stable  Wean O2 off  as tolerated-currently on 3 L   *Acute on chronic systolic congestive heart failure exacerbation  Ejection fraction 20-25% Continue congestive heart failure protocol, Lasix, strict I&O monitoring, daily weights, antihypertensives unable to be used given hypotension   *Acute hypotension  Increase  Midodrine 10 mg 3 times daily   *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 Nehemiah Massed. Dr. Clayborn Bigness to refer to CT surgery as outpatient  * A. fib/a flutter with rapid ventricular rate Stable Discontinued amiodarone drip and digoxin Continue amiodarone 200 mg p.o. twice daily for 2 weeks then daily per Dr. Clayborn Bigness, Eliquis  *Bronchospasm Resolving Continue breathing treatments PRN  *Chronic tobacco smoker abuse/dependency Cessation recommended  nicotine patch offered - patient refuses  *Acute ENTEROCOCCUS FAECALIS UTI Resolving Continue Augmentin to complete antibiotic course  *Abdominal distention  small to moderate ascites per [place] Continue Lasix  *left arm swelling, Left upper extremity superficial thrombosis/thrombophlebitis of the cephalic vein per venous duplex Continue Eliquis   All the records are reviewed and case discussed with Care Management/Social Worker. Management plans discussed with the patient, his sister and they are in agreement.  CODE STATUS: Full Code  TOTAL TIME TAKING CARE OF THIS PATIENT: 35 minutes.   More than 50% of the time was spent in counseling/coordination of care: YES  POSSIBLE D/C IN 1-3 DAYS, DEPENDING ON CLINICAL CONDITION.   Avel Peace  M.D on 02/22/2019 at 12:48 PM  Between 7am to 6pm - Pager - [phone]  After 6pm go to [email or web address] - [employer] [facility]

## 2019-02-22 NOTE — Plan of Care (Signed)
  Problem: Clinical Measurements: Goal: Will remain free from infection Outcome: Progressing Note:  Remains afebrile, on PO antibiotic for UTI Goal: Diagnostic test results will improve Outcome: Progressing Goal: Respiratory complications will improve Outcome: Progressing Goal: Cardiovascular complication will be avoided Outcome: Progressing Note:  No arrhythmias over night

## 2019-02-23 LAB — BASIC METABOLIC PANEL
Anion gap: 8 (ref 5–15)
BUN: 10 mg/dL (ref 6–20)
CHLORIDE: 99 mmol/L (ref 98–111)
CO2: 27 mmol/L (ref 22–32)
Calcium: 7.6 mg/dL — ABNORMAL LOW (ref 8.9–10.3)
Creatinine, Ser: 0.85 mg/dL (ref 0.61–1.24)
GFR calc Af Amer: >60 mL/min (ref >60)
GFR calc non Af Amer: >60 mL/min (ref >60)
Glucose, Bld: 119 mg/dL — ABNORMAL HIGH (ref 70–99)
Potassium: 3.6 mmol/L (ref 3.5–5.1)
Sodium: 134 mmol/L — ABNORMAL LOW (ref 135–145)

## 2019-02-23 MED ORDER — METOLAZONE 2.5 MG PO TABS
2.5000 mg | ORAL_TABLET | Freq: Once | ORAL | Status: AC
  Administered 2019-02-23: 2.5 mg via ORAL
  Filled 2019-02-23: qty 1

## 2019-02-23 MED ORDER — POTASSIUM CHLORIDE CRYS ER 20 MEQ PO TBCR
40.0000 meq | EXTENDED_RELEASE_TABLET | Freq: Two times a day (BID) | ORAL | Status: DC
  Administered 2019-02-23 – 2019-02-28 (×10): 40 meq via ORAL
  Filled 2019-02-23 (×10): qty 2

## 2019-02-23 MED ORDER — METOLAZONE 2.5 MG PO TABS
2.5000 mg | ORAL_TABLET | Freq: Every day | ORAL | Status: DC
  Administered 2019-02-24 – 2019-02-26 (×3): 2.5 mg via ORAL
  Filled 2019-02-23 (×4): qty 1

## 2019-02-23 NOTE — Progress Notes (Addendum)
[facility] NAME: Ricky Ray    MR#:  [id number]  DATE OF BIRTH:  October 31, 1961  SUBJECTIVE:  CHIEF COMPLAINT:   Chief Complaint  Patient presents with  . Shortness of Breath  . Leg Swelling   Patient without complaint, wean O2 off as tolerate - will assess for home O2, start zaroxlyn, ambulate, PT eval REVIEW OF SYSTEMS:  Review of Systems  Constitutional: Positive for malaise/fatigue. Negative for chills and fever.  HENT: Negative for sore throat.   Eyes: Negative for blurred vision and double vision.  Respiratory: Positive for shortness of breath. Negative for cough, hemoptysis, sputum production, wheezing and stridor.   Cardiovascular: Positive for leg swelling. Negative for chest pain, palpitations and orthopnea.  Gastrointestinal: Negative for abdominal pain, blood in stool, diarrhea, melena, nausea and vomiting.  Genitourinary: Negative for dysuria, flank pain and hematuria.  Musculoskeletal: Negative for back pain and joint pain.  Skin: Negative for rash.  Neurological: Negative for dizziness, sensory change, focal weakness, seizures, loss of consciousness, weakness and headaches.  Endo/Heme/Allergies: Negative for polydipsia.  Psychiatric/Behavioral: Negative for depression. The patient is not nervous/anxious.     DRUG ALLERGIES:  No Known Allergies VITALS:  Blood pressure 107/71, pulse 75, temperature (!) 97.4 F (36.3 C), temperature source Oral, resp. rate 18, height 5\' 8"  (1.727 m), weight 88.2 kg, SpO2 97 %. PHYSICAL EXAMINATION:  Physical Exam Constitutional:      General: He is not in acute distress. HENT:     Head: Normocephalic.     Mouth/Throat:     Mouth: Mucous membranes are moist.  Eyes:     General: No scleral icterus.    Conjunctiva/sclera: Conjunctivae normal.     Pupils: Pupils are equal, round, and reactive to light.  Neck:     Musculoskeletal: Normal range of motion and neck supple.   Vascular: No JVD.     Trachea: No tracheal deviation.  Cardiovascular:     Rate and Rhythm: Normal rate and regular rhythm.     Heart sounds: Normal heart sounds. No murmur. No gallop.   Pulmonary:     Effort: Pulmonary effort is normal. No respiratory distress.     Breath sounds: Rales present. No wheezing.  Abdominal:     General: Bowel sounds are normal. There is no distension.     Palpations: Abdomen is soft.     Tenderness: There is no abdominal tenderness. There is no rebound.  Genitourinary:    Comments: Scrotal swelling. Musculoskeletal: Normal range of motion.        General: Swelling present. No tenderness.     Right lower leg: Edema present.     Left lower leg: Edema present.     Comments: Bilateral leg swelling up to thighs and abdomen, Left arm swelling.  Skin:    Findings: No erythema or rash.  Neurological:     Mental Status: He is alert and oriented to person, place, and time.     Cranial Nerves: No cranial nerve deficit.  Psychiatric:        Mood and Affect: Mood normal.    LABORATORY PANEL:  Male CBC Recent Labs  Lab 02/22/19 0515  WBC 4.1  HGB 12.4*  HCT 36.1*  PLT 145*   ------------------------------------------------------------------------------------------------------------------ Chemistries  Recent Labs  Lab 02/22/19 0515 02/23/19 0507  NA 134* 134*  K 3.4* 3.6  CL 98 99  CO2 27 27  GLUCOSE 102* 119*  BUN 10 10  CREATININE 0.84 0.85  CALCIUM 7.5* 7.6*  MG 1.8  --    RADIOLOGY:  No results found. ASSESSMENT AND PLAN:  Patient is a 58 year old white male with no known medical history of congestive heart failure presents with shortness of breath and swelling of the lower extremity  *Acute respiratory failure with hypoxia  Stable due to acute systolic CHF exacerbation Stable  Wean O2 off as tolerated-currently on 3 L, will assess for home O2   *Acute on chronic systolic congestive heart failure exacerbation  Resolving slowly  Ejection fraction 20-25% Continue CHF protocol, IV Lasix, start Zaroxolyn, strict I&O monitoring, daily weights, antihypertensives unable to be used given hypotension, increase activity, PT evaluation  *Acute hypotension  Stable Continue increased Midodrine 10 mg TID   *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 Nehemiah Ray. Dr. Clayborn Ray to refer to CT surgery as outpatient  * A. fib/a flutter with rapid ventricular rate Stable Discontinued amiodarone drip and digoxin Continue amiodarone 200 mg p.o. twice daily for 2 weeks then daily per Dr. Clayborn Ray, Eliquis  *Bronchospasm Resolving Continue breathing treatments PRN  *Chronic tobacco smoker abuse/dependency Cessation recommended  nicotine patch offered - patient refuses  *Acute ENTEROCOCCUS FAECALIS UTI Resolving Continue Amoxil to complete antibiotic course  *Abdominal distention  small to moderate ascites per [place] Continue Lasix  *left arm swelling, Left upper extremity superficial thrombosis/thrombophlebitis of the cephalic vein per venous duplex Continue Eliquis   All the records are reviewed and case discussed with Care Management/Social Worker. Management plans discussed with the patient, his sister and they are in agreement.  CODE STATUS: Full Code  TOTAL TIME TAKING CARE OF THIS PATIENT: 35 minutes.   More than 50% of the time was spent in counseling/coordination of care: YES  POSSIBLE D/C IN 1-3 DAYS, DEPENDING ON CLINICAL CONDITION.   Ricky Ray M.D on 02/23/2019 at 12:16 PM  Between 7am to 6pm - Pager - [phone]  After 6pm go to [email or web address] - [facility]

## 2019-02-23 NOTE — Evaluation (Signed)
Physical Therapy Evaluation Patient Details Name: LEVITICUS HARTON MRN: [id number] DOB: November 25, 1961 Today's Date: 02/23/2019   History of Present Illness  Verdie Barrows is a 58yo Male who comes to [facility] on 3/21 c SOB after 4 weeks of progressive BLE edema, scrotal edema. PMH: DVT, gastric ulcer, ETOH abuse, CHF c EF 20-25%.   Clinical Impression  Pt admitted with above diagnosis. Pt currently with functional limitations due to the deficits listed below (see "PT Problem List"). Upon entry, pt in bed, no family/caregiver present. The pt is awake and agreeable to participate.  The pt is alert and oriented x3, pleasant, conversational, and following commands consistently. Mod independence with bed mobility, and supervision for transfers. Pt AMB in room with RW at [employer] on 3L/min, SpO2 drops to 80%, with no improvement over 15 seconds, then moved to 4L/min with no improvement over 15 seconds, then moved to 5L and recovery to 89% after 30 seconds. Functional mobility assessment demonstrates increased effort/time requirements, poor tolerance, and need for physical assistance, whereas the patient performed these at a higher level of independence PTA. Pt will benefit from skilled PT intervention to increase independence and safety with basic mobility in preparation for discharge to the venue listed below.       Follow Up Recommendations Home health PT;Supervision - Intermittent(will need help with IADL.)    Equipment Recommendations  Rolling walker with 5" wheels    Recommendations for Other Services       Precautions / Restrictions Precautions Precautions: Fall Restrictions Weight Bearing Restrictions: No      Mobility  Bed Mobility Overal bed mobility: Modified Independent             General bed mobility comments: additional time needed   Transfers Overall transfer level: Needs assistance Equipment used: Rolling walker (2 wheeled) Transfers: Sit to/from [employer] Sit to Stand: Supervision Stand pivot transfers: Supervision          Ambulation/Gait Ambulation/Gait assistance: Min guard Gait Distance (Feet): 32 Feet Assistive device: Rolling walker (2 wheeled)       General Gait Details: Gait quality dictated by scrotal edema.  AMB on 3L/min, then 80% at end. Moved to 5L after no improvement while resting.   Stairs            Wheelchair Mobility    Modified Rankin (Stroke Patients Only)       Balance Overall balance assessment: Modified Independent;No apparent balance deficits (not formally assessed)                                           Pertinent Vitals/Pain Pain Assessment: 0-10 Pain Score: 7  Pain Location:  Left forearm; also has BLE numbness associated with edema.  Pain Intervention(s): Limited activity within patient's tolerance    Home Living Family/patient expects to be discharged to:: Private residence Living Arrangements: Alone Available Help at Discharge: Family(sister leaves next door; (mother as well) ) Type of Home: Mobile home Home Access: Stairs to enter Entrance Stairs-Rails: Can reach both Entrance Stairs-Number of Steps: 8-10 steps at rear entrance; no prior trouble Home Layout: One level Home Equipment: None Additional Comments: no falls in past 12 months    Prior Function Level of Independence: Independent         Comments: community AMB; sister takes hime to grocery       Hand Dominance  Dominant Hand: Right    Extremity/Trunk Assessment   Upper Extremity Assessment Upper Extremity Assessment: Generalized weakness;Overall [facility] for tasks assessed    Lower Extremity Assessment Lower Extremity Assessment: Generalized weakness;Overall WFL for tasks assessed       Communication   Communication: No difficulties  Cognition Arousal/Alertness: Awake/alert Behavior During Therapy: WFL for tasks assessed/performed Overall Cognitive Status: Within  Functional Limits for tasks assessed                                        General Comments      Exercises     Assessment/Plan    PT Assessment Patient needs continued PT services  PT Problem List Cardiopulmonary status limiting activity;Decreased balance;Decreased activity tolerance       PT Treatment Interventions DME instruction;Therapeutic exercise;Gait training;Functional mobility training;Therapeutic activities;Patient/family education    PT Goals (Current goals can be found in the Care Plan section)  Acute Rehab PT Goals Patient Stated Goal: regain strength, improve breathing without O2 use PT Goal Formulation: With patient Time For Goal Achievement: 03/09/19 Potential to Achieve Goals: Fair    Frequency Min 2X/week   Barriers to discharge   8-10 steps to enter home    Co-evaluation               AM-PAC PT "6 Clicks" Mobility  Outcome Measure Help needed turning from your back to your side while in a flat bed without using bedrails?: A Little Help needed moving from lying on your back to sitting on the side of a flat bed without using bedrails?: A Little Help needed moving to and from a bed to a chair (including a wheelchair)?: A Little Help needed standing up from a chair using your arms (e.g., wheelchair or bedside chair)?: A Little Help needed to walk in hospital room?: A Little Help needed climbing 3-5 steps with a railing? : A Lot 6 Click Score: 17    End of Session Equipment Utilized During Treatment: Oxygen Activity Tolerance: Patient tolerated treatment well;Treatment limited secondary to medical complications (Comment)(delayed re-saturation after activity) Patient left: Other (comment);with call bell/phone within reach(on BSCm RN/NA made aware. ) Nurse Communication: Mobility status PT Visit Diagnosis: Other abnormalities of gait and mobility (R26.89);Difficulty in walking, not elsewhere classified (R26.2)    Time:  2831-5176 PT Time Calculation (min) (ACUTE ONLY): 34 min   Charges:   PT Evaluation $PT Eval Moderate Complexity: 1 Mod PT Treatments $Therapeutic Exercise: 8-22 mins        1:13 PM, 02/23/19 Etta Grandchild, PT, DPT Physical Therapist - [facility]  [phone] ([place])     [place] C 02/23/2019, 1:06 PM

## 2019-02-23 NOTE — Plan of Care (Signed)
  Problem: Health Behavior/Discharge Planning: Goal: Ability to manage health-related needs will improve Outcome: Progressing   Problem: Activity: Goal: Risk for activity intolerance will decrease Outcome: Progressing   Problem: Pain Managment: Goal: General experience of comfort will improve Outcome: Progressing   Problem: Elimination: Goal: Will not experience complications related to bowel motility Outcome: Progressing   Problem: Safety: Goal: Ability to remain free from injury will improve Outcome: Progressing   Problem: Education: Goal: Ability to demonstrate management of disease process will improve Outcome: Progressing

## 2019-02-23 NOTE — Progress Notes (Signed)
This Probation officer received report and assumed care for Mr. Mogg at approximately 0930 on today, 02/23/19. Patient currently resting in bed. Vital signs stable, respirations even and unlabored, no apparent distress noted. Will cont to monitor.  Ena Dawley, RN

## 2019-02-24 DIAGNOSIS — Z515 Encounter for palliative care: Secondary | ICD-10-CM

## 2019-02-24 DIAGNOSIS — I5021 Acute systolic (congestive) heart failure: Secondary | ICD-10-CM

## 2019-02-24 DIAGNOSIS — Z7189 Other specified counseling: Secondary | ICD-10-CM

## 2019-02-24 LAB — BASIC METABOLIC PANEL
Anion gap: 5 (ref 5–15)
BUN: 10 mg/dL (ref 6–20)
CALCIUM: 7.6 mg/dL — AB (ref 8.9–10.3)
CO2: 29 mmol/L (ref 22–32)
CREATININE: 0.66 mg/dL (ref 0.61–1.24)
Chloride: 98 mmol/L (ref 98–111)
GFR calc Af Amer: >60 mL/min (ref >60)
GFR calc non Af Amer: >60 mL/min (ref >60)
Glucose, Bld: 108 mg/dL — ABNORMAL HIGH (ref 70–99)
Potassium: 4.2 mmol/L (ref 3.5–5.1)
Sodium: 132 mmol/L — ABNORMAL LOW (ref 135–145)

## 2019-02-24 LAB — MAGNESIUM: Magnesium: 1.9 mg/dL (ref 1.7–2.4)

## 2019-02-24 MED ORDER — FUROSEMIDE 10 MG/ML IJ SOLN
40.0000 mg | Freq: Two times a day (BID) | INTRAMUSCULAR | Status: DC
  Administered 2019-02-24 – 2019-02-25 (×2): 40 mg via INTRAVENOUS
  Filled 2019-02-24 (×2): qty 4

## 2019-02-24 NOTE — Consult Note (Addendum)
Consultation Note Date: 02/24/2019   Patient Name: Ricky Ray  DOB: 05/12/1961  MRN: [id number]  Age / Sex: 58 y.o., male  PCP: Patient, No Pcp Per Referring Physician: Gorden Harms, MD  Reason for Consultation: Establishing goals of care  HPI/Patient Profile: 58 y.o. male  with past medical history of ITP, DVT, gastric ulcer admitted on 02/15/2019 with SOB and leg swelling with newly diagnosed CHF EF 20-25% with possible myxoma and new atrial fibrillation.   Clinical Assessment and Goals of Care: I met today with Ricky Ray. He is very flat affect. He is very overwhelmed with his new health conditions. He is able to tell me that there is something wrong with his heart but unable to tell me any details. We discussed the process of heart failure along with symptoms and trajectory. We discussed his goals and he certainly wants to go home but may be unrealistic with the improvement in swelling and strength that he expects - we discussed expectations. We also discussed code status and his wishes if he were to decline further. He tells me that his doctor has been talking with him about this but he is not sure what he thinks or wants. I asked about who he trusts to help him with these decisions or make decisions for him if he is not able and he names his sister, [name]. He gives me permission to call [name].   I called and spoke with [name] and she has much better understanding of her brother's health situation and expectations with advanced heart failure. We were able to speak of the trajectory and the decisions at hand regarding code status and even considering hospice at home. [name] has good questions and wants to help support her brother but she does work and also helps care for their mother. I have added her cell phone to his emergency contact. His mother is alive but is older and has poor understanding and memory.    I will follow up tomorrow with Mr. Dill and his sister, [name].   Primary Decision Maker PATIENT    SUMMARY OF RECOMMENDATIONS   Needs further [place] discussions  Code Status/Advance Care Planning:  Full code   Symptom Management:   Per primary and cardiology at this time  Palliative Prophylaxis:   Bowel Regimen, Delirium Protocol and Frequent Pain Assessment  Psycho-social/Spiritual:   Desire for further Chaplaincy support:no  Additional Recommendations: Caregiving  Support/Resources, Education on Hospice and Grief/Bereavement Support  Prognosis:   < 6 months likely with likely non operable myxoma and advanced heart failure.   Discharge Planning: To Be Determined      Primary Diagnoses: Present on Admission: **None**   I have reviewed the medical record, interviewed the patient and family, and examined the patient. The following aspects are pertinent.  Past Medical History:  Diagnosis Date  . DVT (deep venous thrombosis) ([place])   . Gastric ulcer   . History of ITP    Social History   Socioeconomic History  . Marital status: Single  Spouse name: Not on file  . Number of children: Not on file  . Years of education: Not on file  . Highest education level: Not on file  Occupational History  . Not on file  Social Needs  . Financial resource strain: Not on file  . Food insecurity:    Worry: Not on file    Inability: Not on file  . Transportation needs:    Medical: Not on file    Non-medical: Not on file  Tobacco Use  . Smoking status: Current Every Day Smoker  . Smokeless tobacco: Never Used  Substance and Sexual Activity  . Alcohol use: Yes  . Drug use: Not on file  . Sexual activity: Not on file  Lifestyle  . Physical activity:    Days per week: Not on file    Minutes per session: Not on file  . Stress: Not on file  Relationships  . Social connections:    Talks on phone: Not on file    Gets together: Not on file    Attends religious  service: Not on file    Active member of club or organization: Not on file    Attends meetings of clubs or organizations: Not on file    Relationship status: Not on file  Other Topics Concern  . Not on file  Social History Narrative  . Not on file   Family History  Problem Relation Age of Onset  . CAD Father    Scheduled Meds: . apixaban  5 mg Oral BID  . aspirin EC  81 mg Oral Daily  . furosemide  40 mg Intravenous BID  . mouth rinse  15 mL Mouth Rinse BID  . metolazone  2.5 mg Oral Daily  . midodrine  10 mg Oral TID WC  . polyethylene glycol  17 g Oral Daily  . potassium chloride  40 mEq Oral BID  . sodium chloride flush  3 mL Intravenous Q12H  . tamsulosin  0.4 mg Oral Daily   Continuous Infusions: . sodium chloride Stopped (02/19/19 0418)   PRN Meds:.sodium chloride, acetaminophen, bisacodyl, levalbuterol, ondansetron (ZOFRAN) IV, polyethylene glycol, sodium chloride flush, traMADol No Known Allergies Review of Systems  Constitutional: Positive for activity change and fatigue.  Respiratory: Positive for shortness of breath.   Cardiovascular: Positive for leg swelling.  Neurological: Positive for weakness.    Physical Exam Vitals signs and nursing note reviewed.  Constitutional:      Appearance: He is ill-appearing.  Cardiovascular:     Rate and Rhythm: Normal rate. Rhythm irregularly irregular.  Pulmonary:     Effort: Pulmonary effort is normal. No tachypnea, accessory muscle usage or respiratory distress.  Abdominal:     General: Abdomen is flat.  Neurological:     Mental Status: He is alert and oriented to person, place, and time.  Psychiatric:     Comments: Flat affect     Vital Signs: BP 97/61 (BP Location: Left Arm)   Pulse (!) 36   Temp 97.9 F (36.6 C) (Oral)   Resp (!) 24   Ht '5\' 8"'  (1.727 m)   Wt 87.3 kg   SpO2 93%   BMI 29.27 kg/m  Pain Scale: 0-10 POSS *See Group Information*: 1-Acceptable,Awake and alert Pain Score: 8    SpO2: SpO2:  93 % O2 Device:SpO2: 93 % O2 Flow Rate: .O2 Flow Rate (L/min): 3 L/min  IO: Intake/output summary:   Intake/Output Summary (Last 24 hours) at 02/24/2019 1401 Last data filed at  02/24/2019 1316 Gross per 24 hour  Intake 480 ml  Output 4050 ml  Net -3570 ml    LBM: Last BM Date: 02/23/19 Baseline Weight: Weight: 72.6 kg Most recent weight: Weight: 87.3 kg     Palliative Assessment/Data: 40%     Time In/Out: 1848-5927, 1530-1610 Time Total: 70 min Greater than 50%  of this time was spent counseling and coordinating care related to the above assessment and plan.  Signed by: Vinie Sill, NP Palliative Medicine Team Pager # [phone] (M-F 8a-5p) Team Phone # [phone] (Nights/Weekends)

## 2019-02-24 NOTE — Progress Notes (Signed)
Nutrition Brief Note  Patient identified for LOS  58 year old white male with no known medical history of congestive heart failure presents with shortness of breath and swelling of the lower extremity  Pt provided CHF education on 3/22. Pt continues to do well; pt eating 100% of meals. Per chart, pt down 8lbs since admit.   Wt Readings from Last 15 Encounters:  02/24/19 87.3 kg  07/14/15 72.6 kg    Body mass index is 29.27 kg/m. Patient meets criteria for overweight based on current BMI.   Current diet order is HH, patient is consuming approximately 100% of meals at this time. Labs and medications reviewed.   No nutrition interventions warranted at this time. If nutrition issues arise, please consult RD.   Koleen Distance MS, RD, LDN Pager #- [phone] Office#- [phone] After Hours Pager: [phone]

## 2019-02-24 NOTE — TOC Progression Note (Addendum)
Transition of Care [facility]) - Progression Note    Patient Details  Name: Ricky Ray MRN: [id number] Date of Birth: [birth year]  Transition of Care [facility]) CM/SW Contact  Elza Rafter, RN Phone Number: 02/24/2019, 9:47 AM  Clinical Narrative:   Palliative consult pending.  Offered home health services.  Referral to [facility] as patient is uninsured.  Provided CMS.gov ratings.  Referral to [facility] with [employer] for rolling walker.  Patient is currently on 3L oxygen; please try to wean prior to DC.  Patients cell number is [phone].    Expected Discharge Plan: Home/Self Care Barriers to Discharge: Continued Medical Work up  Expected Discharge Plan and Services Expected Discharge Plan: Home/Self Care   Discharge Planning Services: HF Clinic, CM Consult   Living arrangements for the past 2 months: Mobile Home                           Social Determinants of Health (SDOH) Interventions    Readmission Risk Interventions No flowsheet data found.

## 2019-02-24 NOTE — Progress Notes (Signed)
[facility] [place] NAME: Ricky Ray    MR#:  [id number]  DATE OF BIRTH:  16-Mar-1961  SUBJECTIVE:  CHIEF COMPLAINT:   Chief Complaint  Patient presents with  . Shortness of Breath  . Leg Swelling   Patient without complaint, feeling better, continues to have O2 requirement- will assess for home O2, physical therapy recommending home health PT with rolling walker, case discussed with cardiology/Dr. 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 Negative for chills and fever.  HENT: Negative for sore throat.   Eyes: Negative for blurred vision and double vision.  Respiratory: Positive for shortness of breath. Negative for cough, hemoptysis, sputum production, wheezing and stridor.   Cardiovascular: Positive for leg swelling. Negative for chest pain, palpitations and orthopnea.  Gastrointestinal: Negative for abdominal pain, blood in stool, diarrhea, melena, nausea and vomiting.  Genitourinary: Negative for dysuria, flank pain and hematuria.  Musculoskeletal: Negative for back pain and joint pain.  Skin: Negative for rash.  Neurological: Negative for dizziness, sensory change, focal weakness, seizures, loss of consciousness, weakness and headaches.  Endo/Heme/Allergies: Negative for polydipsia.  Psychiatric/Behavioral: Negative for depression. The patient is not nervous/anxious.     DRUG ALLERGIES:  No Known Allergies VITALS:  Blood pressure 97/61, pulse (!) 36, temperature 97.9 F (36.6 C), temperature source Oral, resp. rate (!) 24, height 5\' 8"  (1.727 m), weight 87.3 kg, SpO2 93 %. PHYSICAL EXAMINATION:  Physical Exam  Constitutional:      General: He is not in acute distress. HENT:     Head: Normocephalic.     Mouth/Throat:     Mouth: Mucous membranes are moist.  Eyes:     General: No scleral icterus.    Conjunctiva/sclera: Conjunctivae normal.     Pupils: Pupils are equal, round, and reactive to light.  Neck:     Musculoskeletal: Normal range of motion and neck supple.     Vascular: No JVD.     Trachea: No tracheal deviation.  Cardiovascular:     Rate and Rhythm: Normal rate and regular rhythm.     Heart sounds: Normal heart sounds. No murmur. No gallop.   Pulmonary:     Effort: Pulmonary effort is normal. No respiratory distress.     Breath sounds: Rales present. No wheezing.  Abdominal:     General: Bowel sounds are normal. There is no distension.     Palpations: Abdomen is soft.     Tenderness: There is no abdominal tenderness. There is no rebound.  Genitourinary:    Comments: Scrotal swelling. Musculoskeletal: Normal range of motion.        General: Swelling present. No tenderness.     Right lower leg: Edema present.     Left lower leg: Edema present.     Comments: Bilateral leg swelling up to thighs and abdomen, Left arm swelling.  Skin:    Findings: No erythema or rash.  Neurological:     Mental Status: He is alert and oriented to person, place, and time.     Cranial Nerves: No cranial nerve deficit.  Psychiatric:        Mood and Affect: Mood normal.    LABORATORY PANEL:  Male CBC Recent Labs  Lab 02/22/19 [phone]  WBC 4.1  HGB 12.4*  HCT 36.1*  PLT 145*   ------------------------------------------------------------------------------------------------------------------ Chemistries  Recent Labs  Lab 02/24/19 0443  NA 132*  K 4.2  CL 98  CO2 29  GLUCOSE 108*  BUN 10  CREATININE 0.66  CALCIUM 7.6*  MG 1.9   RADIOLOGY:  No results found. ASSESSMENT AND PLAN:  Patient is a 58 year old white male with no known medical history of congestive heart failure presents  with shortness of breath and swelling of the lower extremity  *Acute respiratory failure with hypoxia  Stable due to acute systolic CHF exacerbation Stable  Wean O2 off as tolerated-currently on 3 L, will assess for home O2   *Acute on chronic systolic congestive heart failure exacerbation  Resolving slowly Ejection fraction 20-25% Continue CHF protocol, IV Lasix, Zaroxolyn, strict I&O monitoring, daily weights, antihypertensives unable to be used given hypotension, increase activity, PT evaluation  *Acute hypotension  Stable Continue increased Midodrine 10 mg TID   *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 Nehemiah Massed. Discussed with cardiology/Dr. Shary Key is not a candidate for operative intervention of his myxoma/prognosis is poor/palliative or hospice appropriate from their standpoint, palliative care consulted   * A. fib/a flutter with rapid ventricular rate Stable Weaned off amiodarone drip and digoxin, cardiology recommended discontinuation of p.o. Cardizem given persistent bradycardia February 24, 2019 Continue Eliquis  *Bronchospasm Resolved BTs prn  *Chronic tobacco smoker abuse/dependency Cessation recommended  nicotine patch offered - patient refuses  *Acute ENTEROCOCCUS FAECALIS UTI Resolved Treated with course of Amoxil   *Abdominal distention  small to moderate ascites per [place] Continue Lasix  *left arm swelling, Left upper extremity superficial thrombosis/thrombophlebitis of the cephalic vein per venous duplex Continue Eliquis, treated with course of Amoxil  Prognosis long-term use poor-palliative care consulted, will assess for home oxygen, PT recommending home health PT with rolling walker with 5 inch wheels Disposition Home in 1 to 2 days barring any complications   All the records are reviewed and case discussed with Care  Management/Social Worker. Management plans discussed with the patient, his sister and they are in agreement.  CODE STATUS: Full Code  TOTAL TIME TAKING CARE OF THIS PATIENT: 35 minutes.   More than 50% of the time was spent in counseling/coordination of care: YES  POSSIBLE D/C IN 1-2 DAYS, DEPENDING ON CLINICAL CONDITION.   Avel Peace Emad Brechtel M.D on 02/24/2019 at 1:02 PM  Between 7am to 6pm - Pager - [phone]  After 6pm go to [email or web address] - [facility]

## 2019-02-24 NOTE — Progress Notes (Signed)
Subjective:  Patient states to be somewhat improved but is still significantly dyspneic with any activity swollen and feels somewhat improved denies any chest pain  Objective:  Vital Signs in the last 24 hours: Temp:  [97.5 F (36.4 C)-97.9 F (36.6 C)] 97.9 F (36.6 C) (03/30 0744) Pulse Rate:  [36-58] 36 (03/30 0744) Resp:  [18-24] 24 (03/30 0549) BP: (93-97)/(61-67) 97/61 (03/30 0744) SpO2:  [93 %-99 %] 93 % (03/30 0744) Weight:  [87.3 kg] 87.3 kg (03/30 0500)  Intake/Output from previous day: 03/29 0701 - 03/30 0700 In: 480 [P.O.:480] Out: 2500 [Urine:2500] Intake/Output from this shift: Total I/O In: 240 [P.O.:240] Out: 2050 [Urine:2050]  Physical Exam: General appearance: appears older than stated age Neck: no adenopathy, no carotid bruit, no JVD, supple, symmetrical, trachea midline and thyroid not enlarged, symmetric, no tenderness/mass/nodules Lungs: diminished breath sounds bibasilar and bilaterally, rales bibasilar and bilaterally and rhonchi bibasilar and bilaterally Heart: irregularly irregular rhythm Abdomen: soft, non-tender; bowel sounds normal; no masses,  no organomegaly Extremities: edema 2-3+ edema Pulses: 2+ and symmetric Skin: Skin color, texture, turgor normal. No rashes or lesions Neurologic: Alert and oriented X 3, normal strength and tone. Normal symmetric reflexes. Normal coordination and gait  Lab Results: Recent Labs    02/22/19 0515  WBC 4.1  HGB 12.4*  PLT 145*   Recent Labs    02/23/19 0507 02/24/19 0443  NA 134* 132*  K 3.6 4.2  CL 99 98  CO2 27 29  GLUCOSE 119* 108*  BUN 10 10  CREATININE 0.85 0.66   No results for input(s): TROPONINI in the last 72 hours.  Invalid input(s): CK, MB Hepatic Function Panel No results for input(s): PROT, ALBUMIN, AST, ALT, ALKPHOS, BILITOT, BILIDIR, IBILI in the last 72 hours. No results for input(s): CHOL in the last 72 hours. No results for input(s): PROTIME in the last 72  hours.  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  Plan Agree with telemetry Continue diuretic therapy for heart failure Continue ACE inhibitor beta-blocker if blood pressure tolerated Recommend aggressive medical therapy for heart failure Referred patient to the heart failure clinic Currently on amiodarone for A. fib would recommend reducing the dose because of bradycardia Poor anticoagulation candidate at this point for A. fib with high chads score Cardiomyopathy ejection fraction less than 25% continue ACE inhibitor beta-blocker consider Entresto Consider supplemental oxygen therapy for hypoxemia Patient appears to be a poor surgical candidate for possible myxoma in the ventricle Patient is a poor candidate for any significant intervention because of his comorbid state Strongly consider palliative care and conservative medical therapy at this point Potential for full recovery is relatively low Case discussed with primary hospitalist Dr. Jerelyn Charles   LOS: 9 days    Cortney Mckinney D Merrell Borsuk 02/24/2019, 3:43 PM

## 2019-02-25 ENCOUNTER — Ambulatory Visit: Payer: Non-veteran care | Admitting: Family

## 2019-02-25 LAB — CBC
HCT: 41.4 % (ref 39.0–52.0)
Hemoglobin: 13.9 g/dL (ref 13.0–17.0)
MCH: 33 pg (ref 26.0–34.0)
MCHC: 33.6 g/dL (ref 30.0–36.0)
MCV: 98.3 fL (ref 80.0–100.0)
Platelets: 217 10*3/uL (ref 150–400)
RBC: 4.21 MIL/uL — ABNORMAL LOW (ref 4.22–5.81)
RDW: 15.2 % (ref 11.5–15.5)
WBC: 6.5 10*3/uL (ref 4.0–10.5)
nRBC: 0 % (ref 0.0–0.2)

## 2019-02-25 LAB — BASIC METABOLIC PANEL
Anion gap: 10 (ref 5–15)
BUN: 13 mg/dL (ref 6–20)
CALCIUM: 8.4 mg/dL — AB (ref 8.9–10.3)
CO2: 31 mmol/L (ref 22–32)
Chloride: 95 mmol/L — ABNORMAL LOW (ref 98–111)
Creatinine, Ser: 0.82 mg/dL (ref 0.61–1.24)
GFR calc Af Amer: >60 mL/min (ref >60)
GFR calc non Af Amer: >60 mL/min (ref >60)
Glucose, Bld: 108 mg/dL — ABNORMAL HIGH (ref 70–99)
Potassium: 4.4 mmol/L (ref 3.5–5.1)
Sodium: 136 mmol/L (ref 135–145)

## 2019-02-25 MED ORDER — LEVALBUTEROL HCL 0.63 MG/3ML IN NEBU
0.6300 mg | INHALATION_SOLUTION | Freq: Four times a day (QID) | RESPIRATORY_TRACT | Status: DC | PRN
  Administered 2019-02-27: 0.63 mg via RESPIRATORY_TRACT
  Filled 2019-02-25: qty 3

## 2019-02-25 MED ORDER — BUDESONIDE 0.5 MG/2ML IN SUSP: 0.5000 mg | Freq: Two times a day (BID) | RESPIRATORY_TRACT | Status: DC

## 2019-02-25 MED ORDER — IPRATROPIUM BROMIDE 0.02 % IN SOLN
0.5000 mg | Freq: Three times a day (TID) | RESPIRATORY_TRACT | Status: DC
  Administered 2019-02-25: 0.5 mg via RESPIRATORY_TRACT
  Filled 2019-02-25: qty 2.5

## 2019-02-25 MED ORDER — BUDESONIDE 0.5 MG/2ML IN SUSP
0.5000 mg | Freq: Two times a day (BID) | RESPIRATORY_TRACT | Status: DC
  Administered 2019-02-25 – 2019-02-28 (×6): 0.5 mg via RESPIRATORY_TRACT
  Filled 2019-02-25 (×6): qty 2

## 2019-02-25 MED ORDER — LEVALBUTEROL HCL 0.63 MG/3ML IN NEBU
0.6300 mg | INHALATION_SOLUTION | Freq: Three times a day (TID) | RESPIRATORY_TRACT | Status: DC
  Administered 2019-02-25 (×2): 0.63 mg via RESPIRATORY_TRACT
  Filled 2019-02-25 (×2): qty 3

## 2019-02-25 MED ORDER — FUROSEMIDE 10 MG/ML IJ SOLN
40.0000 mg | Freq: Three times a day (TID) | INTRAMUSCULAR | Status: DC
  Administered 2019-02-25 – 2019-02-26 (×5): 40 mg via INTRAVENOUS
  Filled 2019-02-25 (×5): qty 4

## 2019-02-25 MED ORDER — IPRATROPIUM BROMIDE 0.02 % IN SOLN: 0.5000 mg | Freq: Four times a day (QID) | RESPIRATORY_TRACT | Status: DC | PRN

## 2019-02-25 NOTE — TOC Progression Note (Signed)
Transition of Care [facility]) - Progression Note    Patient Details  Name: EFE FAZZINO MRN: [id number] Date of Birth: 1961/05/16  Transition of Care [facility] CM/SW Contact  Ross Ludwig, [place] Phone Number: 02/25/2019, 6:01 PM  Clinical Narrative:  CSW was informed that palliative met with patient today to discuss goals of care, patient gave palliative and CSW permission to speak to his sister.  Palliative informed CSW that patient's sister wanted to speak with CSW about resources available in the home.  CSW informed her that home health has been set up with [facility], and PT, SN, Aide, and social worker can be arranged.  Patient's sister asked about how to apply for Medicaid, disability, what services are available for VA patients, and also what other resources are available.  CSW gave his sister contact info for [place], [place], and how to apply for disability.  Patient's sister also asked what the differences are between hospice and palliative, CSW explained the main differences.  CSW also was asked about if patient needs SNF yet, and what the process is, CSW informed her that patient does not really need SNF yet per PT.  Patient's sister stated that he has 3 other brothers who can help with taking care of patient, patient also only receives about $200 from [place] and has not applied for disability.  Patient's sister was also informed that patient will need oxygen, and asked if she had a preference and she said they would prefer [facility] since patient's mother uses them.  Patient's sister was planning to speak with patient and discuss discharge plan.  Patient's sister was informed that he may be ready for discharge tomorrow per MD.  Patient's sister did not have any other questions at this time and was appreciative of information given.  Expected Discharge Plan: Home/Self Care Barriers to Discharge: Continued Medical Work up  Expected Discharge Plan and Services Expected Discharge Plan: Home/Self Care    Discharge Planning Services: HF Clinic, CM Consult   Living arrangements for the past 2 months: Mobile Home                           Social Determinants of Health (SDOH) Interventions    Readmission Risk Interventions No flowsheet data found.

## 2019-02-25 NOTE — Progress Notes (Signed)
Palliative:  I met again today with Ricky Ray. He tells me that the plan is to prepare to return home tomorrow. He recognizes that he continues with swelling and weakness. I ask him if he believes he will be able to manage at home and he tells me that he will - "I was managing before I came here with it." Although he is saddened by his declining health he looks forward to returning home even though he knows he will continue to have challenges with his advanced heart failure and a lot of lifestyle changes to make.   We discuss resources and goals of care. At this time he does not feel like he is ready for hospice and wishes to "go home and see" before inviting hospice. However, when we discuss code status he does tell me that he does not want resuscitation. We discussed DNR and the goal to help him live as well as he can for as long as he can. DNR form placed on chart. I also provided him with Living Will/HCPOA packet to take home (unfortunately unable to complete in the hospital d/t lack of witnesses with visitor/volunteer restrictions).   I did call and speak with [name] (sister) again with his permission. I updated her on above conversation. She plans to speak with her brother via telephone. She would like to speak with him more about considering hospice care as she is worried about his high care needs given his poor prognosis. She is a caregiver by profession but also works and cares for their elderly mother. She wants to ensure that they have a good plan in place before he returns home and even considering him coming to live with her and their mother. She agrees with his decision for DNR. I encouraged her to speak with her brother as he trusts her opinion. She would like to speak with CMRN/CSW regarding resources. I have updated Dr. Jerelyn Charles and requested CSW to speak with sister.   Exam: Alert, oriented. No distress. On oxygen. BLE 3+ edema.   Plan: - Family to discuss options and plan with  anticipation of return home.  - Optimize and diureses per primary and cardiology.  - I will f/u tomorrow.   22 min  Vinie Sill, NP Palliative Medicine Team Pager # [phone] (M-F 8a-5p) Team Phone # [phone] (Nights/Weekends)

## 2019-02-25 NOTE — Progress Notes (Signed)
SATURATION QUALIFICATIONS: (This note is used to comply with regulatory documentation for home oxygen)  Patient Saturations on Room Air at Rest = 88%  Patient Saturations on Room Air while Ambulating   Patient Saturations on 2 Liters of oxygen while Ambulating = 95%   Please briefly explain why patient needs home oxygen:  Patient is short of breath with exertion and has been unable to wean off of 02. At rest, patient saturations remain in the 80's on room air.

## 2019-02-25 NOTE — Progress Notes (Addendum)
[place] [facility] [place] NAME: Ricky Ray    MR#:  [id number]  DATE OF BIRTH:  [birth year]  SUBJECTIVE:  CHIEF COMPLAINT:   Chief Complaint  Patient presents with  . Shortness of Breath  . Leg Swelling   Patient without complaint, feeling slightly better, continues to have O2 requirement- will need home O2, PT recommending HHPT with rolling walker, case d/w cardiology/Dr. Charlane Ferretti - prognosis is poor patient given nonoperative candidate for for his myxoma, palliative/hospice appropriate per cardiology, palliative care/hospice/advanced directives discussed with the patient-he is currently not interested at this time REVIEW OF SYSTEMS:  Review of Systems  Constitutional: Positive for malaise/fatigue. Negative for chills and fever.  HENT: Negative for sore throat.   Eyes: Negative for blurred vision and double vision.  Respiratory: Positive for shortness of breath. Negative for cough, hemoptysis, sputum production, wheezing and stridor.   Cardiovascular: Positive for leg swelling. Negative for chest pain, palpitations and orthopnea.  Gastrointestinal: Negative for abdominal pain, blood in stool, diarrhea, melena, nausea and vomiting.  Genitourinary: Negative for dysuria, flank pain and hematuria.  Musculoskeletal: Negative for back pain and joint pain.  Skin: Negative for rash.  Neurological: Negative for dizziness, sensory change, focal weakness, seizures, loss of consciousness, weakness and headaches.  Endo/Heme/Allergies: Negative for polydipsia.  Psychiatric/Behavioral: Negative for depression. The patient is not nervous/anxious.     DRUG ALLERGIES:  No Known Allergies VITALS:  Blood pressure (!) 98/59, pulse 81, temperature (!) 97.5 F (36.4 C), temperature source Oral, resp. rate 16, height 5\' 8"  (1.727 m), weight 82.2 kg, SpO2 95 %. PHYSICAL EXAMINATION:  Physical Exam Constitutional:      General: He is not in acute distress. HENT:      Head: Normocephalic.     Mouth/Throat:     Mouth: Mucous membranes are moist.  Eyes:     General: No scleral icterus.    Conjunctiva/sclera: Conjunctivae normal.     Pupils: Pupils are equal, round, and reactive to light.  Neck:     Musculoskeletal: Normal range of motion and neck supple.     Vascular: No JVD.     Trachea: No tracheal deviation.  Cardiovascular:     Rate and Rhythm: Normal rate and regular rhythm.     Heart sounds: Normal heart sounds. No murmur. No gallop.   Pulmonary:     Effort: Pulmonary effort is normal. No respiratory distress.     Breath sounds: Rales present. No wheezing.  Abdominal:     General: Bowel sounds are normal. There is no distension.     Palpations: Abdomen is soft.     Tenderness: There is no abdominal tenderness. There is no rebound.  Genitourinary:    Comments: Scrotal swelling. Musculoskeletal: Normal range of motion.        General: Swelling present. No tenderness.     Right lower leg: Edema present.     Left lower leg: Edema present.     Comments: Bilateral leg swelling up to thighs and abdomen, Left arm swelling.  Skin:    Findings: No erythema or rash.  Neurological:     Mental Status: He is alert and oriented to person, place, and time.     Cranial Nerves: No cranial nerve deficit.  Psychiatric:        Mood and Affect: Mood normal.    LABORATORY PANEL:  Male CBC Recent Labs  Lab 02/25/19 0425  WBC 6.5  HGB 13.9  HCT 41.4  PLT 217   ------------------------------------------------------------------------------------------------------------------ Chemistries  Recent Labs  Lab 02/24/19 0443 02/25/19 0425  NA 132* 136  K 4.2 4.4  CL 98 95*  CO2 29 31  GLUCOSE 108* 108*  BUN 10 13  CREATININE 0.66 0.82  CALCIUM 7.6* 8.4*  MG 1.9  --    RADIOLOGY:  No results found. ASSESSMENT AND PLAN:  Patient is a 58 year old white male with no known medical history of congestive heart failure presents with shortness of  breath and swelling of the lower extremity  *Acute respiratory failure with hypoxia  Stable due to acute systolic CHF exacerbation Stable  Wean O2 off as tolerated-currently on 3 L, will need home O2 for discharge, hopefully home tomorrow  *Acute on chronic systolic congestive heart failure exacerbation  Stable Ejection fraction 20-25% Continue CHF protocol, IV Lasix, Zaroxolyn, strict I&O monitoring, daily weights, antihypertensives unable to be used given hypotension, PT recommending HH PT status post discharge   *Acute hypotension  Stable Continue increased Midodrine 10 mg TID   *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 Nehemiah Massed. Discussed with cardiology/Dr. Shary Key is not a candidate for operative intervention of his myxoma/prognosis is poor -palliative/hospice appropriate from their standpoint, palliative care following  * A. fib/a flutter with rapid ventricular rate Stable Weaned off amiodarone drip and digoxin, cardiology recommended discontinuation of p.o. Cardizem given persistent bradycardia February 24, 2019 Continue Eliquis  *Bronchospasm Resolved BTs prn  *Chronic tobacco smoker abuse/dependency Cessation recommended  nicotine patch offered - patient refuses  *Acute ENTEROCOCCUS FAECALIS UTI Resolved Treated with course of Amoxil   *Abdominal distention  small to moderate ascites per [place] Continue Lasix  *left arm swelling, Left upper extremity superficial thrombosis/thrombophlebitis of the cephalic vein per venous duplex Continue Eliquis, treated with course of Amoxil  Prognosis long-term use poor-palliative care following, for discharge home tomorrow with home oxygen, PT recommending HHPT w/ rolling walker with 5 inch wheels Disposition Home in 1 day barring any complications  All the records are reviewed and case discussed  with Care Management/Social Worker. Management plans discussed with the patient, his sister and they are in agreement.  CODE STATUS: Full Code  TOTAL TIME TAKING CARE OF THIS PATIENT: 35 minutes.   More than 50% of the time was spent in counseling/coordination of care: YES  POSSIBLE D/C IN 1-2 DAYS, DEPENDING ON CLINICAL CONDITION.   Avel Peace Shyanna Klingel M.D on 02/25/2019 at 11:24 AM  Between 7am to 6pm - Pager - [phone]  After 6pm go to [email or web address] - [employer] [facility]

## 2019-02-25 NOTE — Progress Notes (Signed)
PT Cancellation Note  Patient Details Name: Ricky Ray MRN: [id number] DOB: [birth year]   Cancelled Treatment:    Reason Eval/Treat Not Completed: Other (comment)(Chart reviewed, RN consulted: pt preparring for breathing treatment, declines PT at this time. ) Pt encouraged to consider working with PT tomorrow on stairs performance, as he has 10 stairs at home to access the back-door. Will attempt again at later date/time.   3:45 PM, 02/25/19 Etta Grandchild, PT, DPT Physical Therapist - [facility]  [phone] ([place])    Lemmon C 02/25/2019, 3:44 PM

## 2019-02-26 NOTE — Progress Notes (Signed)
New referral for outpatient Palliative to follow at home received from [facility] and [place]. Plan is for discharge today. Patient to be followed by [facility] home health. Patient information faxed to referral. Thank you. Flo Shanks BSN, RN, [facility] [employer] [phone]

## 2019-02-26 NOTE — Progress Notes (Signed)
Physical Therapy Treatment Patient Details Name: Ricky Ray MRN: [id number] DOB: July 14, 1961 Today's Date: 02/26/2019    History of Present Illness Ricky Ray is a 58yo Male who comes to [facility] on 3/21 Ray SOB after 4 weeks of progressive BLE edema, scrotal edema. PMH: DVT, gastric ulcer, ETOH abuse, CHF Ray EF 20-25%.     PT Comments    Pt received in bed, semirecumbent, breakfast finished, he is agreeable to treatment. Pt voices desire to AMB, but declines encouragement to performs stairs training. Pt is orthostatic and tachycardic from sitting to standing, but is asymptomatic, his HR initially jumps to low 120s and remains there during 200+ft AMB. Pt AMB on 3L/min O2, SpO2 remain stable over first half of distance, but drops to 86% at end. Pt requests up to chair at end of session.      02/26/19 0936  Therapy Vitals  Pulse Rate 90  Patient Position (if appropriate) Orthostatic Vitals  Orthostatic Sitting  BP- Sitting (!) 87/62  Pulse- Sitting 90  Orthostatic Standing at 0 minutes  BP- Standing at 0 minutes (!) 71/47 (pt denies dizziness)  Pulse- Standing at 0 minutes 120  Oxygen Therapy  SpO2 96 %  O2 Device Nasal Cannula  O2 Flow Rate (L/min) 3 L/min       Follow Up Recommendations  Home health PT;Supervision - Intermittent     Equipment Recommendations  Rolling walker with 5" wheels    Recommendations for Other Services       Precautions / Restrictions Precautions Precautions: Fall Restrictions Weight Bearing Restrictions: No    Mobility  Bed Mobility Overal bed mobility: Modified Independent             General bed mobility comments: additional time needed   Transfers Overall transfer level: Modified independent Equipment used: Rolling walker (2 wheeled)(pt's personal RW adjusted to height) Transfers: Sit to/from Stand Sit to Stand: Supervision         General transfer comment: performed twice; orthostatic but  assymptomatic  Ambulation/Gait Ambulation/Gait assistance: Min guard Gait Distance (Feet): 220 Feet Assistive device: Rolling walker (2 wheeled) Gait Pattern/deviations: WFL(Within Functional Limits) Gait velocity: HR remains in low 120s throughout   General Gait Details: on 3L/min throughout; @ 131ft SpO2 91%; at end (258ft) @86 %; Recovers to 89% on 6L/min over 15 seconds. Minimal to no subjective SOB.    Stairs Stairs: (pt is encouraged, but declines.)           Wheelchair Mobility    Modified Rankin (Stroke Patients Only)       Balance Overall balance assessment: Modified Independent;Mild deficits observed, not formally tested                                          Cognition Arousal/Alertness: Awake/alert Behavior During Therapy: WFL for tasks assessed/performed Overall Cognitive Status: Within Functional Limits for tasks assessed                                        Exercises      General Comments        Pertinent Vitals/Pain Pain Assessment: 0-10 Pain Score: 8  Pain Location: Right forearm hematoma site Pain Descriptors / Indicators: Aching Pain Intervention(s): Limited activity within patient's tolerance;Monitored during session;Premedicated before session    Home Living  Prior Function            PT Goals (current goals can now be found in the care plan section) Acute Rehab PT Goals Patient Stated Goal: regain strength, improve breathing without O2 use PT Goal Formulation: With patient Time For Goal Achievement: 03/09/19 Potential to Achieve Goals: Fair Progress towards PT goals: Progressing toward goals    Frequency    Min 2X/week      PT Plan Current plan remains appropriate    Co-evaluation              AM-PAC PT "6 Clicks" Mobility   Outcome Measure  Help needed turning from your back to your side while in a flat bed without using bedrails?: A  Little Help needed moving from lying on your back to sitting on the side of a flat bed without using bedrails?: A Little Help needed moving to and from a bed to a chair (including a wheelchair)?: A Little Help needed standing up from a chair using your arms (e.g., wheelchair or bedside chair)?: A Little Help needed to walk in hospital room?: A Little Help needed climbing 3-5 steps with a railing? : A Little 6 Click Score: 18    End of Session Equipment Utilized During Treatment: Oxygen;Gait belt Activity Tolerance: Patient tolerated treatment well Patient left: with call bell/phone within reach;in chair;with chair alarm set Nurse Communication: Mobility status PT Visit Diagnosis: Other abnormalities of gait and mobility (R26.89);Difficulty in walking, not elsewhere classified (R26.2)     Time: 6644-0347 PT Time Calculation (min) (ACUTE ONLY): 34 min  Charges:  $Therapeutic Exercise: 23-37 mins                     10:27 AM, 02/26/19 Etta Grandchild, PT, DPT Physical Therapist - [facility]  [phone] ([place])    Ricky Ray 02/26/2019, 10:23 AM

## 2019-02-26 NOTE — TOC Progression Note (Signed)
Transition of Care [facility]) - Progression Note    Patient Details  Name: Ricky Ray MRN: [id number] Date of Birth: 1960-12-18  [facility] CM/SW Contact  Elza Rafter, RN Phone Number: 02/26/2019, 3:58 PM  Clinical Narrative:   Faxed all orders, face to face and H&P to Steva Ready at the [facility] number [phone].  Called her and she confirmed she received them. She has started authorization for home health services.  PCP will be calling into patient room tomorrow at 10 am for a tela health appointment.  Patient is aware.  Bethel Born, RN is filling out [place] form for home oxygen; she will email completed form to me and I will fax to the [place].  Diane with [place] respiratory [phone] is aware that I will fax form to [phone].  She is aware that patient will discharge tomorrow and needs oxygen prior to DC.    Spoke with [name], patient's sister, she is getting his home ready for him and is aware of discharge tomorrow.  EMS will transport to home.  Sister is aware that she will need to be present for EMS.  [name] has also arranged to have a ramp placed this week.  Helene Kelp with [facility] is aware of plan as well.     Expected Discharge Plan: Home/Self Care Barriers to Discharge: Continued Medical Work up  Expected Discharge Plan and Services Expected Discharge Plan: Home/Self Care   Discharge Planning Services: HF Clinic, CM Consult   Living arrangements for the past 2 months: Mobile Home                           Social Determinants of Health (SDOH) Interventions    Readmission Risk Interventions No flowsheet data found.

## 2019-02-26 NOTE — Progress Notes (Addendum)
Oxygen Qualification Note  Pt desaturates on room air with mobility and requires supplemental oxygen to maintain a safe saturation level.  SpO2 on room air at rest = 88% SpO2 on 3 liters of O2 at rest = 96%  SpO2 on room air while ambulating = 84% SpO2 on 3 liters of O2 while ambulating = 91%   10:15 AM, 02/26/19 Etta Grandchild, PT, DPT Physical Therapist - [facility]  [phone] [facility])

## 2019-02-26 NOTE — Progress Notes (Signed)
Tramadol given for arm pain.  Ice pack also applied.  He says the ice helped but has since melted.  Ice pack refilled.

## 2019-02-26 NOTE — Progress Notes (Signed)
SATURATION QUALIFICATIONS: (This note is used to comply with regulatory documentation for home oxygen)  Patient Saturations on 3LNC at Rest = 94% Patient Saturations on 3LNC while Ambulating = 86% Patient Saturations on 6 Liters of oxygen while Ambulating =92%   Please briefly explain why patient needs home oxygen:

## 2019-02-26 NOTE — Progress Notes (Deleted)
   02/26/19 0936  Therapy Vitals  Pulse Rate 90  Patient Position (if appropriate) Orthostatic Vitals  Orthostatic Sitting  BP- Sitting (!) 87/62  Pulse- Sitting 90  Orthostatic Standing at 0 minutes  BP- Standing at 0 minutes (!) 71/47 (pt denies dizziness)  Pulse- Standing at 0 minutes 120  Oxygen Therapy  SpO2 96 %  O2 Device Nasal Cannula  O2 Flow Rate (L/min) 3 L/min

## 2019-02-26 NOTE — TOC Progression Note (Signed)
Transition of Care [facility]) - Progression Note    Patient Details  Name: AMEIR FARIA MRN: [id number] Date of Birth: [birth year]  Transition of Care [facility]) CM/SW Contact  Elza Rafter, RN Phone Number: 02/26/2019, 10:31 AM  Clinical Narrative:   Spoke with sister [name]  [phone].  She is aware of patient's possible discharge today.  She states she offered for him to stay with her next door but he would like to go to his home.  His correct address is [place]  The address listed on face sheet is hers next door.  [place] and spoke with Belenda Cruise in [phone] ext. [phone].  Patient does not have a primary PCP at the [place].  Belenda Cruise is trying to arrange a PCP that will sign home health and oxygen orders as patient has not been seen at the [place] in approximately 10 years. Patient will need qualifying oxygen saturation note prior to discharge; [place] may request additional information related to home oxygen.  Will notify RN when [place] explains requirements.  Out patient palliative to follow; Santiago Glad aware of possible discharge today.  Referral for rolling walker to [facility]; has been delivered to bedside.   Will update notes after speaking with Belenda Cruise from the [place] and assist with discharge disposition.     Expected Discharge Plan: Home/Self Care Barriers to Discharge: Continued Medical Work up  Expected Discharge Plan and Services Expected Discharge Plan: Home/Self Care   Discharge Planning Services: HF Clinic, CM Consult   Living arrangements for the past 2 months: Mobile Home                           Social Determinants of Health (SDOH) Interventions    Readmission Risk Interventions No flowsheet data found.

## 2019-02-26 NOTE — Progress Notes (Signed)
Palliative:  I met again with Ricky Ray and he is in chair, swelling improved, and recently ambulated with PT and tolerated activity much better today. He tells me that he is anticipating going home. He has had a chance to speak with his sister. He is not happy with the thought of ANY services coming into his home but reluctantly agrees to home health. I stressed to him that he has had a major change in his health and that if he is not accepting of help at home to assist him to adjusting and managing his CHF he will be right back in this same position and hospitalized. He understands and is willing to have help at home.   I called sister, Angela, who is working with patient and encouraging him to be compliant. We discussed his need of a scale, daily weights, limiting sodium intake and symptoms to attempt to catch fluid build up to act on before advancing to the need of hospitalization. Angela is anxious about him coming home and just trying to do everything possible to get him good care and look out for her brother. Emotional support provided. All questions/concerns addressed.   Exam: Alert, oriented. No distress. Less SOB. Sitting up in recliner.   Plan: - Home with home health and outpatient palliative services - Would benefit from heart failure follow up and max resources and education with new CHF diagnosis  40 min  Alicia Parker, NP Palliative Medicine Team Pager # [phone] (M-F 8a-5p) Team Phone # [phone] (Nights/Weekends) 

## 2019-02-27 MED ORDER — POTASSIUM CHLORIDE CRYS ER 20 MEQ PO TBCR: 40.0000 meq | EXTENDED_RELEASE_TABLET | Freq: Two times a day (BID) | ORAL | 0 refills | Status: AC

## 2019-02-27 MED ORDER — LEVALBUTEROL HCL 0.63 MG/3ML IN NEBU: 0.6300 mg | INHALATION_SOLUTION | Freq: Four times a day (QID) | RESPIRATORY_TRACT | 0 refills | Status: AC | PRN

## 2019-02-27 MED ORDER — ASPIRIN 81 MG PO TBEC: 81.0000 mg | DELAYED_RELEASE_TABLET | Freq: Every day | ORAL | 0 refills | Status: AC

## 2019-02-27 MED ORDER — MIDODRINE HCL 10 MG PO TABS: 10.0000 mg | ORAL_TABLET | Freq: Three times a day (TID) | ORAL | 0 refills | Status: AC

## 2019-02-27 MED ORDER — APIXABAN 5 MG PO TABS: 5.0000 mg | ORAL_TABLET | Freq: Two times a day (BID) | ORAL | 0 refills | Status: AC

## 2019-02-27 MED ORDER — IPRATROPIUM BROMIDE 0.02 % IN SOLN: 0.5000 mg | Freq: Four times a day (QID) | RESPIRATORY_TRACT | 0 refills | Status: AC | PRN

## 2019-02-27 MED ORDER — BUDESONIDE-FORMOTEROL FUMARATE 160-4.5 MCG/ACT IN AERO: 2.0000 | INHALATION_SPRAY | Freq: Two times a day (BID) | RESPIRATORY_TRACT | 0 refills | Status: AC

## 2019-02-27 MED ORDER — TAMSULOSIN HCL 0.4 MG PO CAPS: 0.4000 mg | ORAL_CAPSULE | Freq: Every day | ORAL | 0 refills | Status: AC

## 2019-02-27 MED ORDER — FUROSEMIDE 20 MG PO TABS: 60.0000 mg | ORAL_TABLET | Freq: Two times a day (BID) | ORAL | 0 refills | Status: AC

## 2019-02-27 MED ORDER — METOLAZONE 2.5 MG PO TABS: 2.5000 mg | ORAL_TABLET | ORAL | 0 refills | Status: AC

## 2019-02-27 NOTE — Clinical Social Work Note (Signed)
CSW updated patient that [facility] will not be able to deliver his oxygen until tomorrow.  CSW also informed patient that the employee pharmacy will be assist with helping him get two weeks of medications to go home with him, while he is waiting for the [facility] to mail his medications.  CSW and case manager continuing to follow patient's progress throughout discharge planning.  Jones Broom. Norval Morton, MSW, LCSW [phone]  02/27/2019 5:39 PM

## 2019-02-27 NOTE — Discharge Summary (Signed)
[place] [facility] [place] NAME: Ricky Ray    MR#:  [id number]  DATE OF BIRTH:  June 23, 1961  DATE OF ADMISSION:  02/15/2019 ADMITTING PHYSICIAN: Dustin Flock, MD  DATE OF DISCHARGE: No discharge date for patient encounter.  PRIMARY CARE PHYSICIAN: Patient, No Pcp Per    ADMISSION DIAGNOSIS:  Atrial flutter, unspecified type ([place]) [I48.92] Congestive heart failure, unspecified HF chronicity, unspecified heart failure type ([place]) [I50.9]  DISCHARGE DIAGNOSIS:  Active Problems:   Acute CHF ([place])   Goals of care, counseling/discussion   Palliative care encounter   SECONDARY DIAGNOSIS:   Past Medical History:  Diagnosis Date  . DVT (deep venous thrombosis) ([place])   . Gastric ulcer   . History of ITP     HOSPITAL COURSE:  Patient is a 58 year old white male with no known medical history of congestive heart failure presents with shortness of breath and swelling of the lower extremity  *Acute respiratory failure with hypoxia  Stable due to acute systolic CHF exacerbation Stable  Currently requiring 3 L at rest/6 L with activity to maintain O2 saturation greater than 92%   *Acute on chronic systolic congestive heart failure exacerbation  Stable Ejection fraction 20-25% Treated on oure CHF protocol, Lasix, Zaroxolyn, antihypertensives unable to be used given severe hypotension, PT recommended HH PT s/p discharge, Cardiology/[facility] stating pt is palliative care/hospice appropriate given end-stage heart failure   *Acute hypotension  Stable Continue Midodrine 10 mg TID   *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 Nehemiah Massed. Discussed with cardiology/Dr. Shary Key is not a candidate for operative intervention of his myxoma/prognosis is poor -palliative/hospice appropriate from their standpoint,  palliative care to continue status post discharge  *A. fib/a flutter with rapid ventricular rate Stable Weaned off amiodarone drip and digoxin, cardiology recommended discontinuation of p.o. Cardizem given persistent bradycardia February 24, 2019 Continue Eliquis  *Bronchospasm Resolved BTs prn  *Chronic tobacco smoker abuse/dependency Cessation recommended  nicotine patch offered - patient refuses  *Acute ENTEROCOCCUS FAECALIS UTI Resolved Treated with course of Amoxil   *Abdominal distention  small to moderate ascites per [place] Continue Lasix  *left arm swelling, Left upper extremity superficial thrombosis/thrombophlebitis of the cephalic vein per venous duplex Continue Eliquis, treated with course of Amoxil  Prognosis long-term use poor-palliative care following, for discharge home with home oxygen, PT recommending HHPT w/ rolling walker with 5 inch wheels  DISCHARGE CONDITIONS:   stable  CONSULTS OBTAINED:    DRUG ALLERGIES:  No Known Allergies  DISCHARGE MEDICATIONS:   Allergies as of 02/27/2019   No Known Allergies     Medication List    TAKE these medications   apixaban 5 MG Tabs tablet Commonly known as:  ELIQUIS Take 1 tablet (5 mg total) by mouth 2 (two) times daily.   aspirin 81 MG EC tablet Take 1 tablet (81 mg total) by mouth daily.   budesonide-formoterol 160-4.5 MCG/ACT inhaler Commonly known as:  Symbicort Inhale 2 puffs into the lungs 2 (two) times daily.   furosemide 20 MG tablet Commonly known as:  Lasix Take 3 tablets (60 mg total) by mouth 2 (two) times daily.   ipratropium 0.02 % nebulizer solution Commonly known as:  ATROVENT Take 2.5 mLs (0.5 mg total) by nebulization every 6 (six) hours as needed for wheezing or shortness of breath.   levalbuterol 0.63 MG/3ML nebulizer solution Commonly  known as:  XOPENEX Take 3 mLs (0.63 mg total) by nebulization every 6 (six) hours as needed for wheezing or shortness of breath.    metolazone 2.5 MG tablet Commonly known as:  ZAROXOLYN Take 1 tablet (2.5 mg total) by mouth every Monday, Wednesday, and Friday. Start taking on:  February 28, 2019   midodrine 10 MG tablet Commonly known as:  PROAMATINE Take 1 tablet (10 mg total) by mouth 3 (three) times daily with meals.   potassium chloride SA 20 MEQ tablet Commonly known as:  K-DUR,KLOR-CON Take 2 tablets (40 mEq total) by mouth 2 (two) times daily.   tamsulosin 0.4 MG Caps capsule Commonly known as:  FLOMAX Take 1 capsule (0.4 mg total) by mouth daily.            Durable Medical Equipment  (From admission, onward)         Start     Ordered   02/27/19 0854  For home use only DME oxygen  Once    Comments:  Acute on chronic hypoxic failure dure to systolic endstage CHF 3L continuous at rest, 6L with ambulation  Question Answer Comment  Mode or (Route) Nasal cannula   Liters per Minute 3   Frequency Continuous (stationary and portable oxygen unit needed)   Oxygen conserving device Yes   Oxygen delivery system Gas      02/27/19 0854   02/27/19 0000  DME Nebulizer machine    Question:  Patient needs a nebulizer to treat with the following condition  Answer:  Respiratory failure ([place])   02/27/19 0906           DISCHARGE INSTRUCTIONS:      If you experience worsening of your admission symptoms, develop shortness of breath, life threatening emergency, suicidal or homicidal thoughts you must seek medical attention immediately by calling 911 or calling your MD immediately  if symptoms less severe.  You Must read complete instructions/literature along with all the possible adverse reactions/side effects for all the Medicines you take and that have been prescribed to you. Take any new Medicines after you have completely understood and accept all the possible adverse reactions/side effects.   Please note  You were cared for by a hospitalist during your hospital stay. If you have any questions about  your discharge medications or the care you received while you were in the hospital after you are discharged, you can call the unit and asked to speak with the hospitalist on call if the hospitalist that took care of you is not available. Once you are discharged, your primary care physician will handle any further medical issues. Please note that NO REFILLS for any discharge medications will be authorized once you are discharged, as it is imperative that you return to your primary care physician (or establish a relationship with a primary care physician if you do not have one) for your aftercare needs so that they can reassess your need for medications and monitor your lab values.    Today   CHIEF COMPLAINT:   Chief Complaint  Patient presents with  . Shortness of Breath  . Leg Swelling    HISTORY OF PRESENT ILLNESS:  58 y.o. male with a known history of DVT, gastric ulcer, history ITP who is presenting to the hospital with complaint of shortness of breath and leg swelling.  Patient states that his symptoms started about 4 weeks ago when he started having shortness of breath which has progressively gotten worse.  He also has  noticed swelling in his abdomen scrotum and lower extremity.  Patient in the emergency room was noted to have atrial flutter.  As well as borderline low blood pressure.  EDMD has spoken to cardiology.  Patient currently denies any chest pain or palpitations.  Denies any fevers or chills.  Does complaint of cough.  Also wheezing intermittently.   VITAL SIGNS:  Blood pressure 104/60, pulse (!) 41, temperature 98.4 F (36.9 C), temperature source Oral, resp. rate 19, height 5\' 8"  (1.727 m), weight 67.9 kg, SpO2 93 %.  I/O:    Intake/Output Summary (Last 24 hours) at 02/27/2019 0908 Last data filed at 02/27/2019 0503 Gross per 24 hour  Intake 960 ml  Output 7025 ml  Net -6065 ml    PHYSICAL EXAMINATION:  GENERAL:  58 y.o.-year-old patient lying in the bed with no acute  distress.  EYES: Pupils equal, round, reactive to light and accommodation. No scleral icterus. Extraocular muscles intact.  HEENT: Head atraumatic, normocephalic. Oropharynx and nasopharynx clear.  NECK:  Supple, no jugular venous distention. No thyroid enlargement, no tenderness.  LUNGS: Normal breath sounds bilaterally, no wheezing, rales,rhonchi or crepitation. No use of accessory muscles of respiration.  CARDIOVASCULAR: S1, S2 normal. No murmurs, rubs, or gallops.  ABDOMEN: Soft, non-tender, non-distended. Bowel sounds present. No organomegaly or mass.  EXTREMITIES: No pedal edema, cyanosis, or clubbing.  NEUROLOGIC: Cranial nerves II through XII are intact. Muscle strength 5/5 in all extremities. Sensation intact. Gait not checked.  PSYCHIATRIC: The patient is alert and oriented x 3.  SKIN: No obvious rash, lesion, or ulcer.   DATA REVIEW:   CBC Recent Labs  Lab 02/25/19 0425  WBC 6.5  HGB 13.9  HCT 41.4  PLT 217    Chemistries  Recent Labs  Lab 02/24/19 0443 02/25/19 0425  NA 132* 136  K 4.2 4.4  CL 98 95*  CO2 29 31  GLUCOSE 108* 108*  BUN 10 13  CREATININE 0.66 0.82  CALCIUM 7.6* 8.4*  MG 1.9  --     Cardiac Enzymes No results for input(s): TROPONINI in the last 168 hours.  Microbiology Results  Results for orders placed or performed during the hospital encounter of 02/15/19  Urine Culture     Status: Abnormal   Collection Time: 02/15/19  7:53 PM  Result Value Ref Range Status   Specimen Description   Final    URINE, RANDOM Performed at [facility], [address] [place], [place] [address]    Special Requests   Final    NONE Performed at [facility], [address] [place], [place] [address]    Culture ESCHERICHIA COLI ENTEROCOCCUS FAECALIS  (A)  Final   Report Status 02/19/2019 FINAL  Final   Organism ID, Bacteria ESCHERICHIA COLI  Final   Organism ID, Bacteria ENTEROCOCCUS FAECALIS  Final      Susceptibility    Escherichia coli - MIC*    AMPICILLIN 8 SENSITIVE Sensitive     CEFAZOLIN <=4 SENSITIVE Sensitive     CEFTRIAXONE <=1 SENSITIVE Sensitive     CIPROFLOXACIN <=0.25 SENSITIVE Sensitive     GENTAMICIN <=1 SENSITIVE Sensitive     IMIPENEM 0.5 SENSITIVE Sensitive     NITROFURANTOIN <=16 SENSITIVE Sensitive     TRIMETH/SULFA <=20 SENSITIVE Sensitive     AMPICILLIN/SULBACTAM 4 SENSITIVE Sensitive     PIP/TAZO <=4 SENSITIVE Sensitive     Extended ESBL NEGATIVE Sensitive     * ESCHERICHIA COLI   Enterococcus faecalis - MIC*  AMPICILLIN <=2 SENSITIVE Sensitive     LEVOFLOXACIN 0.5 SENSITIVE Sensitive     NITROFURANTOIN <=16 SENSITIVE Sensitive     VANCOMYCIN 1 SENSITIVE Sensitive     * ENTEROCOCCUS FAECALIS    RADIOLOGY:  No results found.  EKG:   Orders placed or performed during the hospital encounter of 02/15/19  . EKG 12-Lead  . EKG 12-Lead  . ED EKG  . ED EKG  . EKG 12-Lead  . EKG 12-Lead      Management plans discussed with the patient, family and they are in agreement.  CODE STATUS:     Code Status Orders  (From admission, onward)         Start     Ordered   02/25/19 1149  Do not attempt resuscitation (DNR)  Continuous    Question Answer Comment  In the event of cardiac or respiratory ARREST Do not call a "code blue"   In the event of cardiac or respiratory ARREST Do not perform Intubation, CPR, defibrillation or ACLS   In the event of cardiac or respiratory ARREST Use medication by any route, position, wound care, and other measures to relive pain and suffering. May use oxygen, suction and manual treatment of airway obstruction as needed for comfort.      02/25/19 1148        Code Status History    Date Active Date Inactive Code Status Order ID Comments User Context   02/15/2019 2035 02/25/2019 1148 Full Code [id number]  Dustin Flock, MD Inpatient      TOTAL TIME TAKING CARE OF THIS PATIENT: 40 minutes.    Avel Peace Yoko Mcgahee M.D on 02/27/2019 at 9:08  AM  Between 7am to 6pm - Pager - [phone]  After 6pm go to [email or web address] - password [email or web address] [facility]  Office  [phone]  CC: Primary care physician; Patient, No Pcp Per   Note: This dictation was prepared with Dragon dictation along with smaller phrase technology. Any transcriptional errors that result from this process are unintentional.

## 2019-02-27 NOTE — TOC Progression Note (Signed)
Transition of Care [facility]) - Progression Note    Patient Details  Name: Ricky Ray MRN: [id number] Date of Birth: 1961/09/25  Transition of Care [facility]) CM/SW [place], RN Phone Number: 02/27/2019, 4:39 PM  Clinical Narrative:   Damaris Schooner with Dellie Burns from the [facility].  They cannot get oxygen delivered until tomorrow.  Notified Dr. Jerelyn Charles and Lavella Lemons, RN.  Employee pharmacy is supplying 2 weeks supply of [facility].      Expected Discharge Plan: Home/Self Care Barriers to Discharge: Continued Medical Work up  Expected Discharge Plan and Services Expected Discharge Plan: Home/Self Care   Discharge Planning Services: HF Clinic, CM Consult   Living arrangements for the past 2 months: Mobile Home Expected Discharge Date: 02/27/19                         Social Determinants of Health (SDOH) Interventions    Readmission Risk Interventions No flowsheet data found.

## 2019-02-27 NOTE — TOC Progression Note (Signed)
Transition of Care [facility]) - Progression Note    Patient Details  Name: Ricky Ray MRN: [id number] Date of Birth: 1961-06-09  Transition of Care [facility]) CM/SW [place], RN Phone Number: 02/27/2019, 12:58 PM  Clinical Narrative:   Faxed nebulizer [phone] to [facility] and completed and faxed oxygen form to [facility]- [phone] as directed by [place].  Coordinating with Pam our employee pharmacist arranging for 2 week supply of medications delivered meds to bed as it will take 1-2 weeks to get his prescriptions through the [place].  Lavella Lemons, RN aware to send patient home with inhalers.  Waiting on oxygen from the [place] at this time.       Expected Discharge Plan: Home/Self Care Barriers to Discharge: Continued Medical Work up  Expected Discharge Plan and Services Expected Discharge Plan: Home/Self Care   Discharge Planning Services: HF Clinic, CM Consult   Living arrangements for the past 2 months: Mobile Home Expected Discharge Date: 02/27/19                         Social Determinants of Health (SDOH) Interventions    Readmission Risk Interventions No flowsheet data found.

## 2019-02-28 NOTE — Care Management (Signed)
Notified by Pam in pharmacy that all medications have been filled.  She is delivering to our inpatient pharmacy and they will deliver to floor.

## 2019-02-28 NOTE — TOC Transition Note (Addendum)
Transition of Care [facility]) - CM/SW Discharge Note   Patient Details  Name: Ricky Ray MRN: [id number] Date of Birth: 26-May-1961  Transition of Care [facility]) CM/SW Contact:  Elza Rafter, RN Phone Number: 02/28/2019, 9:15 AM   Clinical Narrative:   Spoke with Melissa with [facility] [phone] this morning inquiring about home oxygen.  The oxygen was delivered to his home last night around 8pm.  Called and spoke with Ricky Ray, patients sister this morning.  She confirmed oxygen delivered.  We discussed how patient should transport home.  Yesterday Ricky Ray did not want EMS.  He walked 250 feet with PT on Wednesday.  She explained she would transport however he wished.  Called and spoke with patient.  Updated him on oxygen delivery and the rest of his medications will be delivered today around 11am from our employee pharmacy.  Once they are delivered he can DC.  Patient would like Ricky Ray to transport him home today.  Spoke with Ricky Ray again and updated her; she is aware to bring portable oxygen tank for discharge.  Spoke with Illene Bolus, RN and updated her.  She is aware to call Ricky Ray when ready for DC.    Santiago Glad, RN with [place] with [facility] is aware of DC today.   Final next level of care: [place] Barriers to Discharge: No Barriers Identified   Patient Goals and CMS Choice        Discharge Placement                       Discharge Plan and Services   Discharge Planning Services: HF Clinic, CM Consult                      Social Determinants of Health (SDOH) Interventions     Readmission Risk Interventions No flowsheet data found.

## 2019-02-28 NOTE — Plan of Care (Signed)
  Problem: Safety: Goal: Ability to remain free from injury will improve Outcome: Progressing   

## 2019-02-28 NOTE — Plan of Care (Signed)
Patient was able to ambulate with oxygen and rolling walker around nurses station without shortness of breath or dizziness.

## 2019-02-28 NOTE — Plan of Care (Signed)

## 2019-02-28 NOTE — Progress Notes (Signed)
Patient given discharge instructions with medications. All medications in the bag and this RN made sure they were all the right meds. Sister [name] called to update on meds. Sister will be picking up patient and bringing portable oxygen tank. IV and tele monitor off.

## 2019-02-28 NOTE — Progress Notes (Signed)
Per order MD does not need notification of the 86/70 BP. Will notify if patient SBP is less than 85. Will continue to monitor.

## 2019-02-28 NOTE — Progress Notes (Signed)
PT Cancellation Note  Patient Details Name: Ricky Ray MRN: [id number] DOB: August 16, 1961   Cancelled Treatment:    Reason Eval/Treat Not Completed: Other (comment)(patient leaving, sitting EOB with nurse in full clothing. ) Patient discharging, upon PT entering room patient with nurse sitting EOB in full clothing prepping for leaving.  Janna Arch, PT, DPT   02/28/2019, 12:29 PM

## 2019-02-28 NOTE — Discharge Summary (Addendum)
[facility] [place] NAME: Ricky Ray    MR#:  [id number]  DATE OF BIRTH:  [birth year]  DATE OF ADMISSION:  02/15/2019 ADMITTING PHYSICIAN: Dustin Flock, MD  DATE OF DISCHARGE: No discharge date for patient encounter.  PRIMARY CARE PHYSICIAN: Patient, No Pcp Per    ADMISSION DIAGNOSIS:  Atrial flutter, unspecified type ([place]) [I48.92] Congestive heart failure, unspecified HF chronicity, unspecified heart failure type ([place]) [I50.9]  DISCHARGE DIAGNOSIS:  Active Problems:   Acute CHF ([place])   Goals of care, counseling/discussion   Palliative care encounter   SECONDARY DIAGNOSIS:   Past Medical History:  Diagnosis Date  . DVT (deep venous thrombosis) ([place])   . Gastric ulcer   . History of ITP     HOSPITAL COURSE:  Patient is a 58 year old white male with no known medical history of congestive heart failure presents with shortness of breath and swelling of the lower extremity  *Acute respiratory failure with hypoxia  Stable due to acute systolic CHF exacerbation Stable  Currently requiring 3 L at rest/6 L with activity to maintain O2 saturation greater than 92%, discharge was delayed due to arranging home oxygen  *Acute on chronic systolic congestive heart failure exacerbation  Stable Ejection fraction 20-25% Treated on oure CHF protocol, Lasix, Zaroxolyn, antihypertensives unable to be used given severe hypotension, PT recommended HH PT s/p discharge, Cardiology/[facility] stating pt is palliative care/hospice appropriate given end-stage heart failure   *Acute hypotension  Stable Continue Midodrine 10 mg TID   *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 Nehemiah Massed. Discussed with cardiology/Dr. Shary Key is not a candidate for operative intervention of his myxoma/prognosis is poor  -palliative/hospice appropriate from their standpoint, palliative care to continue status post discharge  *A. fib/a flutter with rapid ventricular rate Stable Weaned off amiodarone drip and digoxin, cardiology recommended discontinuation of p.o. Cardizem given persistent bradycardia February 24, 2019 Continue Eliquis  *Bronchospasm Resolved BTs prn  *Chronic tobacco smoker abuse/dependency Cessation recommended  nicotine patch offered - patient refuses  *Acute ENTEROCOCCUS FAECALIS UTI Resolved Treated with course of Amoxil   *Abdominal distention  small to moderate ascites per [place] Continue Lasix  *left arm swelling, Left upper extremity superficial thrombosis/thrombophlebitis of the cephalic vein per venous duplex Continue Eliquis, treated with course of Amoxil  Prognosis long-term use poor-palliative care following, for discharge home with home oxygen, PT recommending HHPT w/ rolling walker with 5 inch wheels  DISCHARGE CONDITIONS:   stable  CONSULTS OBTAINED:    DRUG ALLERGIES:  No Known Allergies  DISCHARGE MEDICATIONS:   Allergies as of 02/28/2019   No Known Allergies     Medication List    TAKE these medications   apixaban 5 MG Tabs tablet Commonly known as:  ELIQUIS Take 1 tablet (5 mg total) by mouth 2 (two) times daily.   aspirin 81 MG EC tablet Take 1 tablet (81 mg total) by mouth daily.   budesonide-formoterol 160-4.5 MCG/ACT inhaler Commonly known as:  Symbicort Inhale 2 puffs into the lungs 2 (two) times daily.   furosemide 20 MG tablet Commonly known as:  Lasix Take 3 tablets (60 mg total) by mouth 2 (two) times daily.   ipratropium 0.02 % nebulizer solution Commonly known as:  ATROVENT Take 2.5 mLs (0.5 mg total) by nebulization every 6 (six) hours as needed for wheezing or shortness of breath.  levalbuterol 0.63 MG/3ML nebulizer solution Commonly known as:  XOPENEX Take 3 mLs (0.63 mg total) by nebulization every 6 (six) hours as  needed for wheezing or shortness of breath.   metolazone 2.5 MG tablet Commonly known as:  ZAROXOLYN Take 1 tablet (2.5 mg total) by mouth every Monday, Wednesday, and Friday.   midodrine 10 MG tablet Commonly known as:  PROAMATINE Take 1 tablet (10 mg total) by mouth 3 (three) times daily with meals.   potassium chloride SA 20 MEQ tablet Commonly known as:  K-DUR,KLOR-CON Take 2 tablets (40 mEq total) by mouth 2 (two) times daily.   tamsulosin 0.4 MG Caps capsule Commonly known as:  FLOMAX Take 1 capsule (0.4 mg total) by mouth daily.            Durable Medical Equipment  (From admission, onward)         Start     Ordered   02/27/19 0854  For home use only DME oxygen  Once    Comments:  Acute on chronic hypoxic failure dure to systolic endstage CHF 3L continuous at rest, 6L with ambulation  Question Answer Comment  Mode or (Route) Nasal cannula   Liters per Minute 3   Frequency Continuous (stationary and portable oxygen unit needed)   Oxygen conserving device Yes   Oxygen delivery system Gas      02/27/19 0854   02/27/19 0000  DME Nebulizer machine    Question:  Patient needs a nebulizer to treat with the following condition  Answer:  Respiratory failure ([place])   02/27/19 0906           DISCHARGE INSTRUCTIONS:      If you experience worsening of your admission symptoms, develop shortness of breath, life threatening emergency, suicidal or homicidal thoughts you must seek medical attention immediately by calling 911 or calling your MD immediately  if symptoms less severe.  You Must read complete instructions/literature along with all the possible adverse reactions/side effects for all the Medicines you take and that have been prescribed to you. Take any new Medicines after you have completely understood and accept all the possible adverse reactions/side effects.   Please note  You were cared for by a hospitalist during your hospital stay. If you have any  questions about your discharge medications or the care you received while you were in the hospital after you are discharged, you can call the unit and asked to speak with the hospitalist on call if the hospitalist that took care of you is not available. Once you are discharged, your primary care physician will handle any further medical issues. Please note that NO REFILLS for any discharge medications will be authorized once you are discharged, as it is imperative that you return to your primary care physician (or establish a relationship with a primary care physician if you do not have one) for your aftercare needs so that they can reassess your need for medications and monitor your lab values.    Today   CHIEF COMPLAINT:   Chief Complaint  Patient presents with  . Shortness of Breath  . Leg Swelling    HISTORY OF PRESENT ILLNESS:  58 y.o. male with a known history of DVT, gastric ulcer, history ITP who is presenting to the hospital with complaint of shortness of breath and leg swelling.  Patient states that his symptoms started about 4 weeks ago when he started having shortness of breath which has progressively gotten worse.  He also has noticed  swelling in his abdomen scrotum and lower extremity.  Patient in the emergency room was noted to have atrial flutter.  As well as borderline low blood pressure.  EDMD has spoken to cardiology.  Patient currently denies any chest pain or palpitations.  Denies any fevers or chills.  Does complaint of cough.  Also wheezing intermittently.   VITAL SIGNS:  Blood pressure (!) 77/60, pulse 98, temperature 98 F (36.7 C), temperature source Oral, resp. rate 18, height 5\' 8"  (1.727 m), weight 66 kg, SpO2 99 %.  I/O:    Intake/Output Summary (Last 24 hours) at 02/28/2019 0958 Last data filed at 02/28/2019 0943 Gross per 24 hour  Intake 480 ml  Output 1700 ml  Net -1220 ml    PHYSICAL EXAMINATION:  GENERAL:  58 y.o.-year-old patient lying in the bed  with no acute distress.  EYES: Pupils equal, round, reactive to light and accommodation. No scleral icterus. Extraocular muscles intact.  HEENT: Head atraumatic, normocephalic. Oropharynx and nasopharynx clear.  NECK:  Supple, no jugular venous distention. No thyroid enlargement, no tenderness.  LUNGS: Normal breath sounds bilaterally, no wheezing, rales,rhonchi or crepitation. No use of accessory muscles of respiration.  CARDIOVASCULAR: S1, S2 normal. No murmurs, rubs, or gallops.  ABDOMEN: Soft, non-tender, non-distended. Bowel sounds present. No organomegaly or mass.  EXTREMITIES: No pedal edema, cyanosis, or clubbing.  NEUROLOGIC: Cranial nerves II through XII are intact. Muscle strength 5/5 in all extremities. Sensation intact. Gait not checked.  PSYCHIATRIC: The patient is alert and oriented x 3.  SKIN: No obvious rash, lesion, or ulcer.   DATA REVIEW:   CBC Recent Labs  Lab 02/25/19 0425  WBC 6.5  HGB 13.9  HCT 41.4  PLT 217    Chemistries  Recent Labs  Lab 02/24/19 0443 02/25/19 0425  NA 132* 136  K 4.2 4.4  CL 98 95*  CO2 29 31  GLUCOSE 108* 108*  BUN 10 13  CREATININE 0.66 0.82  CALCIUM 7.6* 8.4*  MG 1.9  --     Cardiac Enzymes No results for input(s): TROPONINI in the last 168 hours.  Microbiology Results  Results for orders placed or performed during the hospital encounter of 02/15/19  Urine Culture     Status: Abnormal   Collection Time: 02/15/19  7:53 PM  Result Value Ref Range Status   Specimen Description   Final    URINE, RANDOM Performed at [facility], [address], [place], [place] [address]    Special Requests   Final    NONE Performed at [facility], [address] [place], [place] [address]    Culture ESCHERICHIA COLI ENTEROCOCCUS FAECALIS  (A)  Final   Report Status 02/19/2019 FINAL  Final   Organism ID, Bacteria ESCHERICHIA COLI  Final   Organism ID, Bacteria ENTEROCOCCUS FAECALIS  Final       Susceptibility   Escherichia coli - MIC*    AMPICILLIN 8 SENSITIVE Sensitive     CEFAZOLIN <=4 SENSITIVE Sensitive     CEFTRIAXONE <=1 SENSITIVE Sensitive     CIPROFLOXACIN <=0.25 SENSITIVE Sensitive     GENTAMICIN <=1 SENSITIVE Sensitive     IMIPENEM 0.5 SENSITIVE Sensitive     NITROFURANTOIN <=16 SENSITIVE Sensitive     TRIMETH/SULFA <=20 SENSITIVE Sensitive     AMPICILLIN/SULBACTAM 4 SENSITIVE Sensitive     PIP/TAZO <=4 SENSITIVE Sensitive     Extended ESBL NEGATIVE Sensitive     * ESCHERICHIA COLI   Enterococcus faecalis - MIC*  AMPICILLIN <=2 SENSITIVE Sensitive     LEVOFLOXACIN 0.5 SENSITIVE Sensitive     NITROFURANTOIN <=16 SENSITIVE Sensitive     VANCOMYCIN 1 SENSITIVE Sensitive     * ENTEROCOCCUS FAECALIS    RADIOLOGY:  No results found.  EKG:   Orders placed or performed during the hospital encounter of 02/15/19  . EKG 12-Lead  . EKG 12-Lead  . ED EKG  . ED EKG  . EKG 12-Lead  . EKG 12-Lead      Management plans discussed with the patient, family and they are in agreement.  CODE STATUS:     Code Status Orders  (From admission, onward)         Start     Ordered   02/25/19 1149  Do not attempt resuscitation (DNR)  Continuous    Question Answer Comment  In the event of cardiac or respiratory ARREST Do not call a "code blue"   In the event of cardiac or respiratory ARREST Do not perform Intubation, CPR, defibrillation or ACLS   In the event of cardiac or respiratory ARREST Use medication by any route, position, wound care, and other measures to relive pain and suffering. May use oxygen, suction and manual treatment of airway obstruction as needed for comfort.      02/25/19 1148        Code Status History    Date Active Date Inactive Code Status Order ID Comments User Context   02/15/2019 2035 02/25/2019 1148 Full Code [id number]  Dustin Flock, MD Inpatient      TOTAL TIME TAKING CARE OF THIS PATIENT: 40 minutes.    Avel Peace Jadalynn Burr M.D on  02/28/2019 at 9:58 AM  Between 7am to 6pm - Pager - [phone]  After 6pm go to [email or web address] - password [email or web address] [place] [facility]  Office  [phone]  CC: Primary care physician; Patient, No Pcp Per   Note: This dictation was prepared with Dragon dictation along with smaller phrase technology. Any transcriptional errors that result from this process are unintentional.

## 2019-03-03 ENCOUNTER — Telehealth: Payer: Self-pay

## 2019-03-03 NOTE — Telephone Encounter (Signed)
   TELEPHONE CALL NOTE  This patient has been deemed a candidate for follow-up tele-health visit to limit community exposure during the Covid-19 pandemic. I spoke with the patient via phone to discuss instructions. The patient was advised to review the section on consent for treatment as well. The patient will receive a phone call 2-3 days prior to their E-Visit at which time consent will be verbally confirmed. A Virtual Office Visit appointment type has been scheduled for 03/04/2019 with Darylene Price FNP.  Vonda Antigua L, CMA 03/03/2019 10:17 AM

## 2019-03-04 ENCOUNTER — Ambulatory Visit: Payer: Non-veteran care | Attending: Family | Admitting: Family

## 2019-03-04 ENCOUNTER — Encounter: Payer: Self-pay | Admitting: Family

## 2019-03-04 ENCOUNTER — Other Ambulatory Visit: Payer: Self-pay

## 2019-03-04 ENCOUNTER — Telehealth: Payer: Self-pay | Admitting: *Deleted

## 2019-03-04 VITALS — BP 110/78 | HR 57 | Wt 141.1 lb

## 2019-03-04 DIAGNOSIS — I5022 Chronic systolic (congestive) heart failure: Secondary | ICD-10-CM

## 2019-03-04 NOTE — Telephone Encounter (Addendum)
Patient's sister [name] called this RNCM because the [place] states they do not have prescriptions that were faxed on 02-28-19 by bedside RN, Lavella Lemons.  This RN CM gave [place] fax number and tanya did fax prescriptions.  Called this evening and spoke with Pharmacist from the [place] and he sees these medications on chart but states they are not "[facility] approved".  He is going to have someone call me first thing in the morning to discuss.  This RNCM also called and faxed Dr. Bing Matter [facility] physician) [phone]; fax [phone] that this patient needs these prescribed medications sent to him ASAP. Will follow up tomorrow morning.  [name] is aware this RNCM is working on this issue.

## 2019-03-04 NOTE — Patient Instructions (Addendum)
Continue weighing daily and call for an overnight weight gain of > 2 pounds or a weekly weight gain of >5 pounds.   Low-Sodium Eating Plan Sodium, which is an element that makes up salt, helps you maintain a healthy balance of fluids in your body. Too much sodium can increase your blood pressure and cause fluid and waste to be held in your body. Your health care provider or dietitian may recommend following this plan if you have high blood pressure (hypertension), kidney disease, liver disease, or heart failure. Eating less sodium can help lower your blood pressure, reduce swelling, and protect your heart, liver, and kidneys. What are tips for following this plan? General guidelines  Most people on this plan should limit their sodium intake to 2,000 mg (milligrams) of sodium each day. Reading food labels   The Nutrition Facts label lists the amount of sodium in one serving of the food. If you eat more than one serving, you must multiply the listed amount of sodium by the number of servings.  Choose foods with less than 140 mg of sodium per serving.  Avoid foods with 300 mg of sodium or more per serving. Shopping  Look for lower-sodium products, often labeled as "low-sodium" or "no salt added."  Always check the sodium content even if foods are labeled as "unsalted" or "no salt added".  Buy fresh foods. ? Avoid canned foods and premade or frozen meals. ? Avoid canned, cured, or processed meats  Buy breads that have less than 80 mg of sodium per slice. Cooking  Eat more home-cooked food and less restaurant, buffet, and fast food.  Avoid adding salt when cooking. Use salt-free seasonings or herbs instead of table salt or sea salt. Check with your health care provider or pharmacist before using salt substitutes.  Cook with plant-based oils, such as canola, sunflower, or olive oil. Meal planning  When eating at a restaurant, ask that your food be prepared with less salt or no salt,  if possible.  Avoid foods that contain MSG (monosodium glutamate). MSG is sometimes added to Chinese food, bouillon, and some canned foods. What foods are recommended? The items listed may not be a complete list. Talk with your dietitian about what dietary choices are best for you. Grains Low-sodium cereals, including oats, puffed wheat and rice, and shredded wheat. Low-sodium crackers. Unsalted rice. Unsalted pasta. Low-sodium bread. Whole-grain breads and whole-grain pasta. Vegetables Fresh or frozen vegetables. "No salt added" canned vegetables. "No salt added" tomato sauce and paste. Low-sodium or reduced-sodium tomato and vegetable juice. Fruits Fresh, frozen, or canned fruit. Fruit juice. Meats and other protein foods Fresh or frozen (no salt added) meat, poultry, seafood, and fish. Low-sodium canned tuna and salmon. Unsalted nuts. Dried peas, beans, and lentils without added salt. Unsalted canned beans. Eggs. Unsalted nut butters. Dairy Milk. Soy milk. Cheese that is naturally low in sodium, such as ricotta cheese, fresh mozzarella, or Swiss cheese Low-sodium or reduced-sodium cheese. Cream cheese. Yogurt. Fats and oils Unsalted butter. Unsalted margarine with no trans fat. Vegetable oils such as canola or olive oils. Seasonings and other foods Fresh and dried herbs and spices. Salt-free seasonings. Low-sodium mustard and ketchup. Sodium-free salad dressing. Sodium-free light mayonnaise. Fresh or refrigerated horseradish. Lemon juice. Vinegar. Homemade, reduced-sodium, or low-sodium soups. Unsalted popcorn and pretzels. Low-salt or salt-free chips. What foods are not recommended? The items listed may not be a complete list. Talk with your dietitian about what dietary choices are best for you. Grains Instant hot   cereals. Bread stuffing, pancake, and biscuit mixes. Croutons. Seasoned rice or pasta mixes. Noodle soup cups. Boxed or frozen macaroni and cheese. Regular salted crackers.  Self-rising flour. Vegetables Sauerkraut, pickled vegetables, and relishes. Olives. [place] fries. Onion rings. Regular canned vegetables (not low-sodium or reduced-sodium). Regular canned tomato sauce and paste (not low-sodium or reduced-sodium). Regular tomato and vegetable juice (not low-sodium or reduced-sodium). Frozen vegetables in sauces. Meats and other protein foods Meat or fish that is salted, canned, smoked, spiced, or pickled. Bacon, ham, sausage, hotdogs, corned beef, chipped beef, packaged lunch meats, salt pork, jerky, pickled herring, anchovies, regular canned tuna, sardines, salted nuts. Dairy Processed cheese and cheese spreads. Cheese curds. Blue cheese. Feta cheese. String cheese. Regular cottage cheese. Buttermilk. Canned milk. Fats and oils Salted butter. Regular margarine. Ghee. Bacon fat. Seasonings and other foods Onion salt, garlic salt, seasoned salt, table salt, and sea salt. Canned and packaged gravies. Worcestershire sauce. Tartar sauce. Barbecue sauce. Teriyaki sauce. Soy sauce, including reduced-sodium. Steak sauce. Fish sauce. Oyster sauce. Cocktail sauce. Horseradish that you find on the shelf. Regular ketchup and mustard. Meat flavorings and tenderizers. Bouillon cubes. Hot sauce and Tabasco sauce. Premade or packaged marinades. Premade or packaged taco seasonings. Relishes. Regular salad dressings. Salsa. Potato and tortilla chips. Corn chips and puffs. Salted popcorn and pretzels. Canned or dried soups. Pizza. Frozen entrees and pot pies. Summary  Eating less sodium can help lower your blood pressure, reduce swelling, and protect your heart, liver, and kidneys.  Most people on this plan should limit their sodium intake to 1,500-2,000 mg (milligrams) of sodium each day.  Canned, boxed, and frozen foods are high in sodium. Restaurant foods, fast foods, and pizza are also very high in sodium. You also get sodium by adding salt to food.  Try to cook at home, eat  more fresh fruits and vegetables, and eat less fast food, canned, processed, or prepared foods. This information is not intended to replace advice given to you by your health care provider. Make sure you discuss any questions you have with your health care provider. Document Released: 05/05/2002 Document Revised: 11/06/2016 Document Reviewed: 11/06/2016 Elsevier Interactive Patient Education  2019 [employer].

## 2019-03-04 NOTE — Progress Notes (Signed)
Virtual Visit via Telephone Note    Evaluation Performed:  Initial visit  This visit type was conducted due to national recommendations for restrictions regarding the COVID-19 Pandemic (e.g. social distancing).  This format is felt to be most appropriate for this patient at this time.  All issues noted in this document were discussed and addressed.  No physical exam was performed (except for noted visual exam findings with Video Visits).  Please refer to the patient's chart (MyChart message for video visits and phone note for telephone visits) for the patient's consent to telehealth for [facility]  Date:  03/04/2019   ID:  Ricky Ray, DOB Jan 04, 1961, MRN [id number]  Patient Location:  [place] [id number]   Provider location:   [facility] [address], [place] [address]  PCP:  [place] Cardiologist:  Lujean Amel, MD Electrophysiologist:  None   Chief Complaint:  Fatigue  History of Present Illness:    Ricky Ray is a 58 y.o. male who presents via audio/video conferencing for a telehealth visit today. Patient confirmed DOB and address.    The patient does not have symptoms concerning for COVID-19 infection (fever, chills, cough, or new SHORTNESS OF BREATH).   Patient reports minimal fatigue upon moderate exertion. He says that this has been present for several weeks. He has associated "puffiness in feet" which he says has been present since hospital admission. He currently denies any difficulty sleeping, head congestion, sore throat, cough, shortness of breath, wheezing, chest pain, palpitations, dizziness or weight gain. Has to contact [facility] to get a nebulizer machine.   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   Past Medical  History:  Diagnosis Date  . DVT (deep venous thrombosis) ([place])   . Gastric ulcer   . History of ITP    Past Surgical History:  Procedure Laterality Date  . gastric ulcer    . TEE WITHOUT CARDIOVERSION N/A 02/17/2019   Procedure: TRANSESOPHAGEAL ECHOCARDIOGRAM (TEE);  Surgeon: Corey Skains, MD;  Location: [facility] ORS;  Service: Cardiovascular;  Laterality: N/A;     Current Meds  Medication Sig  . apixaban (ELIQUIS) 5 MG TABS tablet Take 1 tablet (5 mg total) by mouth 2 (two) times daily.  Marland Kitchen aspirin EC 81 MG EC tablet Take 1 tablet (81 mg total) by mouth daily.  . budesonide-formoterol (SYMBICORT) 160-4.5 MCG/ACT inhaler Inhale 2 puffs into the lungs 2 (two) times daily.  . furosemide (LASIX) 20 MG tablet Take 3 tablets (60 mg total) by mouth 2 (two) times daily. (Patient taking differently: Take 40 mg by mouth 2 (two) times daily. 2 tablets AM/ 1 tablet PM)  . ipratropium (ATROVENT) 0.02 % nebulizer solution Take 2.5 mLs (0.5 mg total) by nebulization every 6 (six) hours as needed for wheezing or shortness of breath.  . levalbuterol (XOPENEX) 0.63 MG/3ML nebulizer solution Take 3 mLs (0.63 mg total) by nebulization every 6 (six) hours as needed for wheezing or shortness of breath.  . metolazone (ZAROXOLYN) 2.5 MG tablet Take 1 tablet (2.5 mg total) by mouth every Monday, Wednesday, and Friday.  . midodrine (PROAMATINE) 10 MG tablet Take 1 tablet (10 mg total) by mouth 3 (three) times daily with meals.  . potassium chloride SA (K-DUR,KLOR-CON) 20 MEQ  tablet Take 2 tablets (40 mEq total) by mouth 2 (two) times daily.  . tamsulosin (FLOMAX) 0.4 MG CAPS capsule Take 1 capsule (0.4 mg total) by mouth daily.     Allergies:   Patient has no known allergies.   Social History   Tobacco Use  . Smoking status: Current Every Day Smoker  . Smokeless tobacco: Never Used  Substance Use Topics  . Alcohol use: Yes  . Drug use: Not on file     Family Hx: The patient's family history includes  CAD in his father.  ROS:   Please see the history of present illness.     All other systems reviewed and are negative.   Labs/Other Tests and Data Reviewed:    Recent Labs: 02/15/2019: ALT 10; B Natriuretic Peptide 3,586.0 02/24/2019: Magnesium 1.9 02/25/2019: BUN 13; Creatinine, Ser 0.82; Hemoglobin 13.9; Platelets 217; Potassium 4.4; Sodium 136   Recent Lipid Panel No results found for: CHOL, TRIG, HDL, CHOLHDL, LDLCALC, LDLDIRECT  Wt Readings from Last 3 Encounters:  03/04/19 141 lb 2 oz (64 kg)  02/28/19 145 lb 6.4 oz (66 kg)  07/14/15 160 lb (72.6 kg)     Exam:    Vital Signs:  BP 110/78 Comment: self-reported  Pulse (!) 57 Comment: self-reported  Wt 141 lb 2 oz (64 kg) Comment: self-reported  BMI 21.46 kg/m    Well nourished, well developed male in no  acute distress.   ASSESSMENT & PLAN:    1. Chronic heart failure with reduced ejection fraction-   - NYHA class II - euvolemic per patient's description - weighing daily and says that his weight ranges from 141-143 pounds. Reminded to call for an overnight weight gain of >2 pounds or a weekly weight gain of >5 pounds - "rarely" adds salt to his food and he was encouraged to not add any salt. Reviewed the importance of closely following a 2000mg  sodium diet and written dietary information will be mailed to him - BP too low to consider adding entresto/ may not be able to tolerate ACE-I/ ARB - currently bradycardic so unable to add beta-blocker at this time - Dr. 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  The importance of social distancing was discussed today.  Patient Risk:   After full review of this patients clinical status, I feel that they are at least moderate risk at this time.  Time:   Today, I have spent 20 minutes with the patient with telehealth technology discussing heart failure, medications, daily weights and symptoms to call about.     Medication Adjustments/Labs and Tests Ordered: Current medicines are reviewed at length with the patient  today.  Concerns regarding medicines are outlined above.   Tests Ordered: No orders of the defined types were placed in this encounter.  Medication Changes: No orders of the defined types were placed in this encounter.   Disposition:  Follow-up in 1 month or sooner for any questions/problems before then.   Signed, Alisa Graff, FNP  03/04/2019 9:46 AM    [facility]

## 2019-03-05 ENCOUNTER — Telehealth: Payer: Self-pay | Admitting: *Deleted

## 2019-03-05 NOTE — Telephone Encounter (Signed)
Cathy called this RN CM this morning and asked me to fax the discharge medication list to lead pharmacist Dr. Ofilia Neas @ [phone].  Tye Maryland will contact [facility] and ask her to mail out prescriptions ASAP.  Called sister [name] and left her a voicemail telling her that I faxed DC medications to the lead pharmacist.  I left [name] the Pharmacists and [employer] contact information.

## 2019-03-07 ENCOUNTER — Telehealth: Payer: Self-pay

## 2019-03-07 NOTE — Telephone Encounter (Signed)
Patient's sister, [name],  contacted for Palliative Care visit.  Due to the current COVID-19 infection/crises, the patient and family prefer, and have given their verbal consent for, a provider visit via telemedicine. HIPPA policies of confidentially were discussed and patient/family expressed understanding.  Visit scheduled for 03/25/2019

## 2019-03-14 ENCOUNTER — Telehealth: Payer: Self-pay | Admitting: *Deleted

## 2019-03-14 NOTE — Telephone Encounter (Signed)
Received call from [name], Patients sister today.  She left a message on my voicemail that she still hasn't received prescriptions from the [place] via mail and when she calls the [place] they state they do not have any prescriptions or faxes from this RNCM.  She states he will be out of his medications after today.  DC summary, prescriptions and home medications were all faxed several times per previous notes.   Provided patient with Orlene Plum contact number (CM at the [place]) and the lead pharmacists number Ofilia Neas.  Called patient back to see if she was able to reach anyone and she did not answer.  Left her a message to please call be back and update me on progress.  Patient was discharged with all hardcopy prescriptions as well.

## 2019-03-14 NOTE — Telephone Encounter (Signed)
Called and spoke with [name] at the transfer center at the [facility].  She remembers this patient from a few weeks ago when I spoke with her.  [name] is going to follow up with Steva Ready and Ofilia Neas about patients mail order medications.

## 2019-03-14 NOTE — Telephone Encounter (Signed)
[name] from the transfer center called me back and confirmed that Ricky Ray is aware that all medications will be available for pick up this evening at 5pm at the [facility].

## 2019-03-25 ENCOUNTER — Other Ambulatory Visit: Payer: Self-pay

## 2019-03-25 ENCOUNTER — Encounter: Payer: Self-pay | Admitting: Nurse Practitioner

## 2019-03-25 ENCOUNTER — Other Ambulatory Visit: Payer: Non-veteran care | Admitting: Nurse Practitioner

## 2019-03-25 DIAGNOSIS — Z515 Encounter for palliative care: Secondary | ICD-10-CM

## 2019-03-25 DIAGNOSIS — R0602 Shortness of breath: Secondary | ICD-10-CM

## 2019-03-25 DIAGNOSIS — R531 Weakness: Secondary | ICD-10-CM

## 2019-03-25 NOTE — Progress Notes (Signed)
[facility] Consult Note Telephone: [phone]  Fax: [phone]  PATIENT NAME: Ricky Ray DOB: 1961/01/08 MRN: [id number]  PRIMARY CARE PROVIDER:   [facility] [address] [place], [place] [address]  RESPONSIBLE PARTY:   Self; sister Ahron Hulbert  Due to the COVID-19 crisis, this visit was done via telemedicine from my office and it was initiated and consent by this patient and or family.  RECOMMENDATIONS and PLAN:  1. Palliative care encounter Z51.5; Palliative medicine team will continue to support patient, patient's family, and medical team. Visit consisted of counseling and education dealing with the complex and emotionally intense issues of symptom management and palliative care in the setting of serious and potentially life-threatening illness  2. Dyspneic R06.00 secondary to CHF (EF 20%) ; continue to encourage O2, Continue daily weights   3. Generalized weakness R53.1  secondary to CHF continue to encourage to wear O2; Encourage energy conservation and rest times.  ASSESSMENT:     I talked with [name] on the phone concerning Mr. Qualls. We talked about Life review. She talked about him living in a trailer without any electricity and recently moved to a place next door to her and her mother. [name] endorses she takes care of her mother at home as well as now her brother. We talked about her understanding of his congestive heart failure with ef of 20%. We talked about chronic disease progression with expectations as advances. We talked about past medical history. She shared that he does continue to smoke. She talked about his alcohol abuse in the past. She feels like he has drank a few beers since he's been home. 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 We talked about his overall health condition and [name] endorses that he does not usually take as well care of  himself. We talked about family dynamics. We talked about what he has been doing with physical therapy. [name] endorses he has been doing little exercises but has been very limited with the increase in his heart rate with any type of exertion. He currently is not wearing his oxygen though at times he will apply it. We talked about symptom management with shortness of breath. [name] endorses that his O2 saturation has been above 90%. He does not appear to be short of breath. We tried to that role of palliative care and plan of care. We talked about Medicare program under Hospice Services. We talked about different scenarios and what is offered. We talked about his eligibility criteria of EF of 20%. We talked about upcoming visit with Mr. Beswick. We ended phone discussion and initiated Telehealth video visit with Mr. Colquhoun and [name].  I did a Telehealth video visit with Mr. Lamantia and [name]. We talked about how he is feeling today. We talked about symptoms of shortness of breath and pain which he denies. We talked about past medical history. We talked about chronic disease progression of congestive heart failure with ef of 20%. We talked about his recent hospitalization. We talked about his work with therapy which Mr. Vane endorses has not been a lot. Mr. Courington endorses that therapy last session has already been completed from his understanding. He had an appointment at the [facility] clinic this past Friday but the appointment ended up being changed after he got there. He did have Labs drawn at that time. We talked about his knowledge of congestive heart  failure in the condition that his heart is currently in. He verbalize he did not understand how they can measure his ejection fraction at 20%. We talked about the ultrasound and how that worked. We talked about the pathophysiology of the heart and how that is measured. We talked about medications. We talked about oxygen. We talked about symptoms including fatigue and  shortness of breath. We talked about realistic expectations with progression of disease. We talked about his appetite. We talked about role of palliative care and plan of care. We talked about medical goals of care including aggressive versus conservative vs comfort care. We talked about most form. We talked about hard Choice book. Mr Mccurdy was open to having information sent to him with a blank most form and copy of Hard Choice book for [name] and he to review. We talked about code status as it was documented that he was a DNR. Mr Kuhlman endorses that that is not what his wishes were. He wanted to be a full code and somebody at the hospital made him in DNR. At present time he wishes to be a full code. He shared if he is gone, dead what harm would it do for them to try to resuscitate him. We talked about the different scenarios concerning CPR. We talked about grim prognosis with his EF of 20% in the setting of a CPR. We talked about the stress a CPR puts a family through in addition to damage it does to one's body. We talked about the small percentage of survival that would be on a ventilator in ICU and  poor prognosis to recover due to comorbidities and EF of 20%. We talked about families decision for a terminal wean if he is placed on a ventilator after a resuscitation and what that will look like for his family. We talked about option of Hospice Services as per recommended by Cardiology. Discussed hospice program and how he appears to be eligible. Discussed information that will send in mail about hospice along with blank most form and hard choice book. Mr. Haub in agreement. We discussed services. Mr. Konicek wanted to further discuss with [name] and rescheduled a follow-up palliative care visit for Friday on Mar 28, 2019 at 1pm. Therapeutic listening and emotional support provided. Questions answered satisfaction. Contact information provided.  Weight 66 kg  3 / 28 / 2020 sodium 134, potassium 3.4, chloride  98, CO2 27, calcium 7.5, bun 10, creatinine 0.84, glucose 102, WBC 4.1, hemoglobin 12.4, hematocrit 36.1, platelets 145  I spent 60 minutes providing this consultation,  from 9:00am to 10:00am. More than 50% of the time in this consultation was spent coordinating communication.   HISTORY OF PRESENT ILLNESS:  JOTHAM AHN is a 58 y.o. 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 Hospitalized 3/21 / 2020 to 4/ 3 / 2020 for shortness of breath with increased swelling of lower extremity. Workup significant for acute respiratory failure with hypoxia secondary to acute systolic congestive heart failure. Ejection fraction of 20 to 25%. Due to severe hypotension antihypertensives are unable to be used. Recommended home health with physical therapy for discharge. Dr. Wilburn Cornelia Cardiology recommended palliative care and hospice appropriate given end-stage heart failure. He was started on Miodrine for hypotension. 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 Atrial flutter with rapid ventricular response  weaned off amiodarone drip and digoxin. Cardiology recommended discontinuation of oral cardizem given persistent bradycardia in 3 / 30 / 2020. He did continue Eliquis. He did experience bronchial spasms that resolved. Chronic tobacco smoker dependency nicotine patches offered but refused. Acute entercoccus  faedalus urinary tract infection resolve with Amoxil. Abdominal distension with small to moderate ascites per ultrasound. Left arm swelling with left upper extremity superficial thrombus / thrombophlebitis of cephalic vein per venous duplex. Continued on course of amoxil with Eliquis. Palliative care was consulted during hospitalization. Per documentation he was very overwhelmed with new health  conditions. Per documentation his wishes are he wants to go home but maybe unrealistic of the time with Improvement and swelling and strength he expects. He named his sister, [name] as his responsible party if he is not able to speak. [name] does work and help care for her mother at home. Prognosis poor palliative care to follow with home O2, Physical Therapy recommended with rolling walker. He lives in his own home next door to his sister [name] and mother. He is ambulatory and performs adl's though it does take him some time. He has found a shower chair to be helpful. . Palliative Care was asked to help address goals of care.   CODE STATUS: Full code  PPS: 40% HOSPICE ELIGIBILITY/DIAGNOSIS: TBD  PAST MEDICAL HISTORY:  Past Medical History:  Diagnosis Date   DVT (deep venous thrombosis) (HCC)    Gastric ulcer    History of ITP     SOCIAL HX:  Social History   Tobacco Use   Smoking status: Current Every Day Smoker   Smokeless tobacco: Never Used  Substance Use Topics   Alcohol use: Yes    ALLERGIES: No Known Allergies   PERTINENT MEDICATIONS:  Outpatient Encounter Medications as of 03/25/2019  Medication Sig   apixaban (ELIQUIS) 5 MG TABS tablet Take 1 tablet (5 mg total) by mouth 2 (two) times daily.   aspirin EC 81 MG EC tablet Take 1 tablet (81 mg total) by mouth daily.   budesonide-formoterol (SYMBICORT) 160-4.5 MCG/ACT inhaler Inhale 2 puffs into the lungs 2 (two) times daily.   furosemide (LASIX) 20 MG tablet Take 3 tablets (60 mg total) by mouth 2 (two) times daily. (Patient taking differently: Take 40 mg by mouth 2 (two) times daily. 2 tablets AM/ 1 tablet PM)   ipratropium (ATROVENT) 0.02 % nebulizer solution Take 2.5 mLs (0.5 mg total) by nebulization every 6 (six) hours as needed for wheezing or shortness of breath.   levalbuterol (XOPENEX) 0.63 MG/3ML nebulizer solution Take 3 mLs (0.63 mg total) by nebulization every 6 (six) hours as needed for wheezing or  shortness of breath.   metolazone (ZAROXOLYN) 2.5 MG tablet Take 1 tablet (2.5 mg total) by mouth every Monday, Wednesday, and Friday.   midodrine (PROAMATINE) 10 MG tablet Take 1 tablet (10 mg total) by mouth 3 (three) times daily with meals.   potassium chloride SA (K-DUR,KLOR-CON) 20 MEQ tablet Take 2 tablets (40 mEq total) by mouth 2 (two) times daily.   tamsulosin (FLOMAX) 0.4 MG CAPS capsule Take 1 capsule (0.4 mg total) by mouth daily.   No facility-administered encounter medications on file as of 03/25/2019.     PHYSICAL EXAM:  Deferred   Kenniya Westrich Z Ludean Duhart, NP

## 2019-03-28 ENCOUNTER — Telehealth: Payer: Self-pay

## 2019-03-28 ENCOUNTER — Other Ambulatory Visit: Payer: Self-pay

## 2019-03-28 ENCOUNTER — Encounter: Payer: Self-pay | Admitting: Nurse Practitioner

## 2019-03-28 ENCOUNTER — Other Ambulatory Visit: Payer: Non-veteran care | Admitting: Nurse Practitioner

## 2019-03-28 DIAGNOSIS — R531 Weakness: Secondary | ICD-10-CM

## 2019-03-28 DIAGNOSIS — R0602 Shortness of breath: Secondary | ICD-10-CM | POA: Insufficient documentation

## 2019-03-28 DIAGNOSIS — Z515 Encounter for palliative care: Secondary | ICD-10-CM

## 2019-03-28 NOTE — Telephone Encounter (Signed)
TELEPHONE CALL NOTE  Ricky Ray has been deemed a candidate for a follow-up tele-health visit to limit community exposure during the Covid-19 pandemic. I spoke with the patient via phone to ensure availability of phone/video source, confirm preferred email & phone number, discuss instructions and expectations, and review consent.   I reminded Ricky Ray to be prepared with any vital sign and/or heart rhythm information that could potentially be obtained via home monitoring, at the time of his visit.  Finally, I reminded Ricky Ray to expect an e-mail containing a link for their video-based visit approximately 15 minutes before his visit, or alternatively, a phone call at the time of his visit if his visit is planned to be a phone encounter.  Did the patient verbally consent to treatment as below? YES   ALLRED, AMANDA L, CMA 03/28/2019 12:00 PM  CONSENT FOR TELE-HEALTH VISIT - PLEASE REVIEW  I hereby voluntarily request, consent and authorize [facility] and its employed or contracted physicians, physician assistants, nurse practitioners or other licensed health care professionals (the Practitioner), to provide me with telemedicine health care services (the "Services") as deemed necessary by the treating Practitioner. I acknowledge and consent to receive the Services by the Practitioner via telemedicine. I understand that the telemedicine visit will involve communicating with the Practitioner through telephonic communication technology and the disclosure of certain medical information by electronic transmission. I acknowledge that I have been given the opportunity to request an in-person assessment or other available alternative prior to the telemedicine visit and am voluntarily participating in the telemedicine visit.  I understand that I have the right to withhold or withdraw my consent to the use of telemedicine in the course of my care at any time, without affecting my right to  future care or treatment, and that the Practitioner or I may terminate the telemedicine visit at any time. I understand that I have the right to inspect all information obtained and/or recorded in the course of the telemedicine visit and may receive copies of available information for a reasonable fee.  I understand that some of the potential risks of receiving the Services via telemedicine include:  Marland Kitchen Delay or interruption in medical evaluation due to technological equipment failure or disruption; . Information transmitted may not be sufficient (e.g. poor resolution of images) to allow for appropriate medical decision making by the Practitioner; and/or  . In rare instances, security protocols could fail, causing a breach of personal health information.  Furthermore, I acknowledge that it is my responsibility to provide information about my medical history, conditions and care that is complete and accurate to the best of my ability. I acknowledge that Practitioner's advice, recommendations, and/or decision may be based on factors not within their control, such as incomplete or inaccurate data provided by me or lack of visual representation. I understand that the practice of medicine is not an exact science and that Practitioner makes no warranties or guarantees regarding treatment outcomes. I acknowledge that I will receive a copy of this consent concurrently upon execution via email to the email address I last provided but may also request a printed copy by calling the office of [facility].    I understand that my insurance may be billed for this visit.   I have read or had this consent read to me. . I understand the contents of this consent, which adequately explains the benefits and risks of the Services being provided via telemedicine.  Marland Kitchen  I have been provided ample opportunity to ask questions regarding this consent and the Services and have had my questions answered to my  satisfaction. . I give my informed consent for the services to be provided through the use of telemedicine in my medical care  By participating in this telemedicine visit I agree to the above.

## 2019-03-28 NOTE — Telephone Encounter (Signed)
   TELEPHONE CALL NOTE  This patient has been deemed a candidate for follow-up tele-health visit to limit community exposure during the Covid-19 pandemic. I spoke with the patient via phone to discuss instructions. The patient was advised to review the section on consent for treatment as well. The patient will receive a phone call 2-3 days prior to their E-Visit at which time consent will be verbally confirmed. A Virtual Office Visit appointment type has been scheduled for 04/01/2019  with Darylene Price FNP.  Zayvier Caravello L, CMA 03/28/2019 12:00 PM

## 2019-03-28 NOTE — Progress Notes (Signed)
[facility] Consult Note Telephone: [phone]  Fax: [phone]  PATIENT NAME: Ricky Ray DOB: 16-Jun-1961 MRN: [id number]  PRIMARY CARE PROVIDER:   [facility] [address] [place], [place] [address]  RESPONSIBLE PARTY:   Self; sister Ricky Ray  Due to the COVID-19 crisis, this visit was done via telemedicine from my office and it was initiated and consent by this patient and or family.  RECOMMENDATIONS and PLAN:  1. Palliative care encounter Z51.5; Palliative medicine team will continue to support patient, patient's family, and medical team. Visit consisted of counseling and education dealing with the complex and emotionally intense issues of symptom management and palliative care in the setting of serious and potentially life-threatening illness  2. Dyspneic R06.00 secondary to CHF (EF 20%) ; continue to encourage O2, Continue daily weights   3. Generalized weaknessR53.1  secondary to CHF continue to encourage to wear O2; Encourage energy conservation and rest times.  ASSESSMENT: I called Ricky Ray and his sister Ricky Ray for Telehealth telephone visit as video not available. Follow up palliative care visit for further discussion of goals of care, Hospice Services. We talked about how Ricky Ray week was. He shared that he does become tired with activity though he rest and it does help. He currently is not wearing his oxygen. Therapy is completed as he had a difficult time with increase in heart rate and decrease in O2 saturation to continue with exercises. He did have a visit Telehealth with cardiologist today and was encouraged to become more active. Ricky Ray endorses after that visit he actually went outside and did yard work, trimming bushes. He did come short of breath but accomplished his task. He is ambulatory, lives by himself, performs adl's. Appetite remains poor. He has  not have any episodes of pain. We talked about medical goals of care. As of today they have not received the blank most form, her choice book or information about Hospice in the mail yet. We talked more about the most form as well as Hospice Services available. Questions answered about what hospice is able to provide. We did talk about Ricky Ray has current clinical condition and asked if we could discuss further with medical director of hospice. We talked about at this point he does continue to be very independent, not requiring oxygen and that best plan maybe to continue on palliative care for now. He has an upcoming appointment at the [facility] Cardiology clinic for further discussion of treatment options. We talked about role of palliative care and plan of care. Follow-up appointment set for palliative care in 2 weeks after [facility] Cardiology clinic appointment. Ricky Ray and Ricky Ray in agreement. Therapeutic listening and emotional support provided. Questions answer satisfaction. Contact information provided      I spent 30 minutes providing this consultation,  from 3:00pm to 3:30pm. More than 50% of the time in this consultation was spent coordinating communication.   HISTORY OF PRESENT ILLNESS:  Ricky Ray is a 58 y.o. 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 Hospitalized 3/21 / 2020 to 4/ 3 / 2020 for shortness of breath with increased swelling of lower extremity. Workup significant for acute respiratory failure with hypoxia secondary to acute systolic congestive heart failure. Ejection fraction of 20 to 25%. Due to severe hypotension antihypertensives are unable to be  used. Recommended home health with physical therapy for discharge. Dr. Wilburn Cornelia Cardiology recommended palliative care and hospice appropriate given end-stage heart failure. He was started on Miodrine for hypotension. 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 Cardiology recommended discontinuation of oral cardizem given persistent bradycardia in 3 / 30 / 2020. He did continue Eliquis. He did experience bronchial spasms that resolved. Palliative Care was asked to help to continue to address goals of care.   CODE STATUS: Full code  PPS: 50% HOSPICE ELIGIBILITY/DIAGNOSIS: TBD  PAST MEDICAL HISTORY:  Past Medical History:  Diagnosis Date  . DVT (deep venous thrombosis) ([facility])   . Gastric ulcer   . History of ITP     SOCIAL HX:  Social History   Tobacco Use  . Smoking status: Current Every Day Smoker  . Smokeless tobacco: Never Used  Substance Use Topics  . Alcohol use: Yes    ALLERGIES: No Known Allergies   PERTINENT MEDICATIONS:  Outpatient Encounter Medications as of 03/28/2019  Medication Sig  . apixaban (ELIQUIS) 5 MG TABS tablet Take 1 tablet (5 mg total) by mouth 2 (two) times daily.  Marland Kitchen aspirin EC 81 MG EC tablet Take 1 tablet (81 mg total) by mouth daily.  . budesonide-formoterol (SYMBICORT) 160-4.5 MCG/ACT inhaler Inhale 2 puffs into the lungs 2 (two) times daily.  . furosemide (LASIX) 20 MG tablet Take 3 tablets (60 mg total) by mouth 2 (two) times daily. (Patient taking differently: Take 40 mg by mouth 2 (two) times daily. 2 tablets AM/ 1 tablet PM)  . ipratropium (ATROVENT) 0.02 % nebulizer solution Take 2.5 mLs (0.5 mg total) by nebulization every 6 (six) hours as needed for wheezing or shortness of breath.  . levalbuterol (XOPENEX) 0.63 MG/3ML nebulizer solution Take 3 mLs (0.63 mg total) by nebulization every 6 (six) hours as needed for wheezing or shortness of breath.  . metolazone (ZAROXOLYN) 2.5 MG tablet Take 1 tablet (2.5 mg total) by mouth every Monday, Wednesday, and Friday.  . midodrine  (PROAMATINE) 10 MG tablet Take 1 tablet (10 mg total) by mouth 3 (three) times daily with meals.  . potassium chloride SA (K-DUR,KLOR-CON) 20 MEQ tablet Take 2 tablets (40 mEq total) by mouth 2 (two) times daily.  . tamsulosin (FLOMAX) 0.4 MG CAPS capsule Take 1 capsule (0.4 mg total) by mouth daily.   No facility-administered encounter medications on file as of 03/28/2019.     PHYSICAL EXAM:   Deferred  Christin Z Gusler, NP

## 2019-04-01 ENCOUNTER — Ambulatory Visit: Payer: Non-veteran care | Attending: Family | Admitting: Family

## 2019-04-01 ENCOUNTER — Other Ambulatory Visit: Payer: Self-pay

## 2019-04-01 ENCOUNTER — Encounter: Payer: Self-pay | Admitting: Family

## 2019-04-01 VITALS — BP 105/75 | HR 138 | Wt 136.0 lb

## 2019-04-01 DIAGNOSIS — Z720 Tobacco use: Secondary | ICD-10-CM

## 2019-04-01 DIAGNOSIS — D151 Benign neoplasm of heart: Secondary | ICD-10-CM

## 2019-04-01 DIAGNOSIS — I5022 Chronic systolic (congestive) heart failure: Secondary | ICD-10-CM

## 2019-04-01 DIAGNOSIS — I959 Hypotension, unspecified: Secondary | ICD-10-CM

## 2019-04-01 NOTE — Progress Notes (Signed)
Virtual Visit via Telephone Note    Evaluation Performed:  Follow-up visit  This visit type was conducted due to national recommendations for restrictions regarding the COVID-19 Pandemic (e.g. social distancing).  This format is felt to be most appropriate for this patient at this time.  All issues noted in this document were discussed and addressed.  No physical exam was performed (except for noted visual exam findings with Video Visits).  Please refer to the patient's chart (MyChart message for video visits and phone note for telephone visits) for the patient's consent to telehealth for [facility]  Date:  04/01/2019   ID:  Ricky Ray, DOB 1961-08-02, MRN [id number]  Patient Location:  [place] [address]   Provider location:   [facility] [place] [address], [place] [address]  PCP:  [facility], [place]  Cardiologist:  [place] Electrophysiologist:  None   Chief Complaint:  Difficulty sleeping  History of Present Illness:    Ricky Ray is a 58 y.o. male who presents via audio/video conferencing for a telehealth visit today.  Patient verified DOB and address.  The patient does not have symptoms concerning for COVID-19 infection (fever, chills, cough, or new SHORTNESS OF BREATH).   Patient reports difficulty falling / staying asleep at night. He describes this as chronic in nature having been present for several years. Sleeps on 2 pillows and has oxygen if he needs it, which he hasn't. He has associated decreased appetite due to bad teeth/ gums and has been drinking 1-2 cans of ensure daily. Doesn't add salt to his food. Denies dizziness, swelling in legs/ abdomen, palpitations, chest pain, shortness of breath, cough, fatigue or weight gain.   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   Past Medical History:  Diagnosis Date  . DVT (deep venous thrombosis) ([place])   . Gastric ulcer   . History of ITP    Past Surgical History:  Procedure Laterality Date  . gastric ulcer    . TEE WITHOUT CARDIOVERSION N/A 02/17/2019   Procedure: TRANSESOPHAGEAL ECHOCARDIOGRAM (TEE);  Surgeon: Corey Skains, MD;  Location: [facility] ORS;  Service: Cardiovascular;  Laterality: N/A;     Current Meds  Medication Sig  . apixaban (ELIQUIS) 5 MG TABS tablet Take 1 tablet (5 mg total) by mouth 2 (two) times daily.  Marland Kitchen aspirin EC 81 MG EC tablet Take 1 tablet (81 mg total) by mouth daily.  . budesonide-formoterol (SYMBICORT) 160-4.5 MCG/ACT inhaler Inhale 2 puffs into the lungs 2 (two) times daily.  Marland Kitchen diltiazem (CARDIZEM SR) 120 MG 12 hr capsule Take 120 mg by mouth 2 (two) times daily. For heart rate >120  . furosemide (LASIX) 20 MG tablet Take 3 tablets (60 mg total) by mouth 2 (two) times daily. (Patient taking differently: Take 40 mg by mouth 2 (two) times daily. 2 tablets AM/ 1 tablet PM)  . ipratropium (ATROVENT) 0.02 % nebulizer solution Take 2.5 mLs (0.5 mg total) by nebulization every 6 (six) hours as needed for wheezing or shortness of breath.  . levalbuterol (XOPENEX) 0.63 MG/3ML nebulizer solution Take 3 mLs (0.63 mg total) by nebulization every 6 (six) hours as needed for wheezing or shortness of breath.  . metolazone (ZAROXOLYN) 2.5  MG tablet Take 1 tablet (2.5 mg total) by mouth every Monday, Wednesday, and Friday.  . midodrine (PROAMATINE) 10 MG tablet Take 1 tablet (10 mg total) by mouth 3 (three) times daily with meals.  . potassium chloride SA (K-DUR,KLOR-CON) 20 MEQ tablet Take 2 tablets (40 mEq total) by mouth 2 (two) times daily. (Patient taking differently: Take 20 mEq by mouth 2 (two) times daily. )  . tamsulosin (FLOMAX) 0.4 MG CAPS capsule Take 1 capsule (0.4 mg total) by mouth daily.     Allergies:    Patient has no known allergies.   Social History   Tobacco Use  . Smoking status: Current Every Day Smoker  . Smokeless tobacco: Never Used  Substance Use Topics  . Alcohol use: Yes  . Drug use: Not on file     Family Hx: The patient's family history includes CAD in his father.  ROS:   Please see the history of present illness.     All other systems reviewed and are negative.   Labs/Other Tests and Data Reviewed:    Recent Labs: 02/15/2019: ALT 10; B Natriuretic Peptide 3,586.0 02/24/2019: Magnesium 1.9 02/25/2019: BUN 13; Creatinine, Ser 0.82; Hemoglobin 13.9; Platelets 217; Potassium 4.4; Sodium 136   Recent Lipid Panel No results found for: CHOL, TRIG, HDL, CHOLHDL, LDLCALC, LDLDIRECT  Wt Readings from Last 3 Encounters:  04/01/19 136 lb (61.7 kg)  03/04/19 141 lb 2 oz (64 kg)  02/28/19 145 lb 6.4 oz (66 kg)     Exam:    Vital Signs:  BP 105/75 Comment: self-reported  Pulse (!) 138 Comment: self-reported  Wt 136 lb (61.7 kg) Comment: self-reported  BMI 20.68 kg/m    Well nourished, well developed male in no  acute distress.   ASSESSMENT & PLAN:    1. Chronic heart failure with reduced ejection fraction-   - NYHA class I - euvolemic per patient's description of symptoms - weighing daily and says that he's lost weight;  Reminded to call for an overnight weight gain of >2 pounds or a weekly weight gain of >5 pounds - not adding salt to his food and is trying to closely follow a low sodium diet. Has been drinking ensure to slow his weight loss - BP too low to consider adding entresto/ may not be able to tolerate ACE-I/ ARB - tachycardic today and he says that he's taking dilitazem PRN for HR >120 and he's concerned about it making his HR too low. Explained the rational for decreasing his HR but also that dilitazem was not the best option due to his EF being low. 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  The importance of social distancing was discussed today.  Patient Risk:   After full  review of this patients clinical status, I feel that they are at least moderate risk at this time.  Time:   Today, I have spent 16 minutes with the patient with telehealth technology discussing medications, diet and symptoms to report.     Medication Adjustments/Labs and Tests Ordered: Current medicines are reviewed at length with the patient today.  Concerns regarding medicines are outlined above.   Tests Ordered: No orders of the defined types were placed in this encounter.  Medication Changes: No orders of the defined types were placed in this encounter.   Disposition:  Follow-up in 1 month or sooner for any questions/problems before then.   Signed, Alisa Graff, FNP  04/01/2019 9:25 AM    [facility]

## 2019-04-01 NOTE — Patient Instructions (Signed)
Continue weighing daily and call for an overnight weight gain of > 2 pounds or a weekly weight gain of >5 pounds. 

## 2019-04-08 ENCOUNTER — Telehealth: Payer: Self-pay | Admitting: Nurse Practitioner

## 2019-04-08 NOTE — Telephone Encounter (Signed)
I called Ricky Ray, Ricky Ray sister for Telehealth visit. At present Ricky Ray endorses that she is currently not with Ricky Ray at his home and would be able to do a telephone update. Ricky Ray talked about a coming appointment tomorrow for cardioversion at the [place]. Ricky Ray did have his appointment with Cardiology and they found him to be an atrial flutter. Plan is to do outpatient cardioversion tomorrow. Ricky Ray talked about his overall functional level. He does continue to work with therapy. Appetite is improving. We reschedule palliative care Telehealth visit for one week after cardioversion. Ricky Ray in agreement. Therapeutic listening an emotional support provided. We talked about role of palliative care. He does remain a full code at present time. Appointment scheduled. Questions answered satisfaction. Contact information provided.  Total time spent 30 minutes Documentation 10 minutes Phone discussion 20 minutes

## 2019-04-17 ENCOUNTER — Other Ambulatory Visit: Payer: Self-pay

## 2019-04-17 ENCOUNTER — Other Ambulatory Visit: Payer: Non-veteran care | Admitting: Nurse Practitioner

## 2019-04-17 ENCOUNTER — Encounter: Payer: Self-pay | Admitting: Nurse Practitioner

## 2019-04-17 DIAGNOSIS — R0602 Shortness of breath: Secondary | ICD-10-CM

## 2019-04-17 DIAGNOSIS — R531 Weakness: Secondary | ICD-10-CM

## 2019-04-17 DIAGNOSIS — Z515 Encounter for palliative care: Secondary | ICD-10-CM

## 2019-04-17 NOTE — Progress Notes (Signed)
[facility] Consult Note Telephone: [phone]  Fax: [phone]  PATIENT NAME: Ricky Ray DOB: [birth year] MRN: [id number]  PRIMARY CARE PROVIDER:   [facility], [place] [address] [place], [place] [address]  RESPONSIBLE PARTY:  Self; Ricky Ray sister  Due to the COVID-19 crisis, this visit was done via telemedicine/telephone as video not available from my office and it was initiated and consent by this patient and or family.  RECOMMENDATIONS and PLAN:  1. Palliative care encounter Z51.5; Palliative medicine team will continue to support patient, patient's family, and medical team. Visit consisted of counseling and education dealing with the complex and emotionally intense issues of symptom management and palliative care in the setting of serious and potentially life-threatening illness  2. Dyspneic R06.00 secondary to CHF (EF 20%) ; continue to encourage O2, Continue daily weights   3. Generalized weaknessR53.1  secondary to CHF continue to encourage to wear O2; Encourage energy conservation and rest times.  ASSESSMENT:     I called Ricky Ray and Ricky Ray for follow-up palliative care visit. Ricky Ray talked about recent appointment at the [facility] where Ricky Ray did receive cardioversion for atrial fibrillation. It was successful and he converted to normal sinus rhythm with stable heart rate. He has been doing good since. He continues to attempt to do yard work limited with periods of rest. He continues to live on his own with frequent visits from [name] his sister. We talked about symptoms of pain and shortness of breath which he denies. We talked about appetite which is good. He is not requiring his oxygen only as needed. We talked about upcoming [place] appointment for follow up. We talked about his appointment Telehealth with Ricky Ray nurse practitioner at [facility]. We  talked about chronic disease progression and next appointment at the [facility] to re-evaluate heart function in hopes of improvement. He continues to currently receive [place] for nursing. Edema is improving. He does remain a full code with aggressive interventions. Talked about goals of care. Talked about role of palliative care and plan of care. Scheduled follow-up visit in 6 weeks after next [place] appointment or sooner should he declined. Will readdress most form at that time. Questions answered their satisfaction. Therapeutic listening with emotional support provided. Contact information provided.  I spent 35 minutes providing this consultation,  from 2:30pm to 3:05pm. More than 50% of the time in this consultation was spent coordinating communication.   HISTORY OF PRESENT ILLNESS:  Ricky Ray is a 58 y.o. year old male with multiple medical problems including CM, Congestive heart failure, Gastric ulcer, deep vein thrombosis, history of ITP, TEE without cardioversion. Hospitalized 3/21 / 2020 to 4/ 3 / 2020 for shortness of breath with increased swelling of lower extremity. Workup significant for acute respiratory failure with hypoxia secondary to acute systolic congestive heart failure. Ejection fraction of 20 to 25%. Palliative Care was asked to help to continue to address goals of care.   CODE STATUS: Full code  PPS: 50% HOSPICE ELIGIBILITY/DIAGNOSIS: TBD  PAST MEDICAL HISTORY:  Past Medical History:  Diagnosis Date   DVT (deep venous thrombosis) (H[facility])    Gastric ulcer    History of ITP     SOCIAL HX:  Social History   Tobacco Use   Smoking status: Current Every Day Smoker   Smokeless tobacco: Never Used  Substance Use Topics   Alcohol use:  Yes    ALLERGIES: No Known Allergies   PERTINENT MEDICATIONS:  Outpatient Encounter Medications as of 04/17/2019  Medication Sig   apixaban (ELIQUIS) 5 MG TABS tablet Take 1 tablet (5 mg total) by mouth 2 (two) times  daily.   aspirin EC 81 MG EC tablet Take 1 tablet (81 mg total) by mouth daily.   budesonide-formoterol (SYMBICORT) 160-4.5 MCG/ACT inhaler Inhale 2 puffs into the lungs 2 (two) times daily.   diltiazem (CARDIZEM SR) 120 MG 12 hr capsule Take 120 mg by mouth 2 (two) times daily. For heart rate >120   furosemide (LASIX) 20 MG tablet Take 3 tablets (60 mg total) by mouth 2 (two) times daily. (Patient taking differently: Take 40 mg by mouth 2 (two) times daily. 2 tablets AM/ 1 tablet PM)   ipratropium (ATROVENT) 0.02 % nebulizer solution Take 2.5 mLs (0.5 mg total) by nebulization every 6 (six) hours as needed for wheezing or shortness of breath.   levalbuterol (XOPENEX) 0.63 MG/3ML nebulizer solution Take 3 mLs (0.63 mg total) by nebulization every 6 (six) hours as needed for wheezing or shortness of breath.   metolazone (ZAROXOLYN) 2.5 MG tablet Take 1 tablet (2.5 mg total) by mouth every Monday, Wednesday, and Friday.   midodrine (PROAMATINE) 10 MG tablet Take 1 tablet (10 mg total) by mouth 3 (three) times daily with meals.   potassium chloride SA (K-DUR,KLOR-CON) 20 MEQ tablet Take 2 tablets (40 mEq total) by mouth 2 (two) times daily. (Patient taking differently: Take 20 mEq by mouth 2 (two) times daily. )   tamsulosin (FLOMAX) 0.4 MG CAPS capsule Take 1 capsule (0.4 mg total) by mouth daily.   No facility-administered encounter medications on file as of 04/17/2019.     PHYSICAL EXAM:   Deferred  Marieliz Strang Z Reily Treloar, NP

## 2019-04-24 ENCOUNTER — Other Ambulatory Visit (HOSPITAL_COMMUNITY): Payer: Self-pay

## 2019-04-24 NOTE — Progress Notes (Signed)
First visit with Ricky Ray.  Explained the program and he is willing to participate.  His sister was also there during the visit.  She helps take care of him.  He is ok with the program, he signed agreement.  He appears to be in denial that he has heart failure, he thinks he can come off all his medications due to he is doing good right now.  He has no swelling, denies chest pain.  His heart rate has been increasing slowly in past 2 weeks, was in 80's after cardioversion now reaching 100.  He checks it daily, with bp and weighs.  He states was sent home with oxygen but has not needed it.  He does not watch his sodium and states he will not.  He does not watch his fluids, he drinks orange juice and pepsi all day.  He drinks very little water.  Mentioned Moms meals to him, he states he wants to eat what he wants to.  He walks daily around the yard.  He smokes and has no plan to stop.  His sister places his medications in his med box and he states he has been taking them.  He states has a cardiology appt middle of June at [facility], he hopes to come off some or all medications. He denies shortness of breath.  He denies headaches or dizziness.  He states sleeps some nights and other nights does not sleep well.  He takes naps in day time some.  Did discuss diet with him and left handouts for him.  Will continue to visit by phone or home at this time.  He is aware of zones (red, yellow, green) and of weight gain of 3 lbs or more overnight and 5 lbs in a week to call.    [place] [phone]

## 2019-04-29 ENCOUNTER — Telehealth: Payer: Self-pay

## 2019-04-29 ENCOUNTER — Ambulatory Visit: Payer: Non-veteran care | Attending: Family | Admitting: Family

## 2019-04-29 ENCOUNTER — Other Ambulatory Visit: Payer: Self-pay

## 2019-04-29 ENCOUNTER — Telehealth: Payer: Self-pay | Admitting: Nurse Practitioner

## 2019-04-29 ENCOUNTER — Encounter: Payer: Self-pay | Admitting: Family

## 2019-04-29 VITALS — BP 119/80 | HR 78 | Wt 138.0 lb

## 2019-04-29 DIAGNOSIS — D151 Benign neoplasm of heart: Secondary | ICD-10-CM

## 2019-04-29 DIAGNOSIS — Z720 Tobacco use: Secondary | ICD-10-CM

## 2019-04-29 DIAGNOSIS — I5022 Chronic systolic (congestive) heart failure: Secondary | ICD-10-CM

## 2019-04-29 DIAGNOSIS — K1379 Other lesions of oral mucosa: Secondary | ICD-10-CM

## 2019-04-29 NOTE — Telephone Encounter (Signed)
   TELEPHONE CALL NOTE  This patient has been deemed a candidate for follow-up tele-health visit to limit community exposure during the Covid-19 pandemic. I spoke with the patient via phone to discuss instructions. The patient was advised to review the section on consent for treatment as well. The patient will receive a phone call 2-3 days prior to their E-Visit at which time consent will be verbally confirmed. A Virtual Office Visit appointment type has been scheduled for 04/29/2019 with Darylene Price FNP.  Vonda Antigua L, CMA 04/29/2019 10:21 AM

## 2019-04-29 NOTE — Patient Instructions (Signed)
Continue weighing daily and call for an overnight weight gain of > 2 pounds or a weekly weight gain of >5 pounds. 

## 2019-04-29 NOTE — Telephone Encounter (Signed)
Mr. Ricky Ray called complaining of an abscess at the top of his mouth making his entire face swollen with severe pain. Mr Ricky Ray endorses he was told to call palliative care about this. We talked about his primary care provider at [facility]. He is requesting palliative care to provide antibiotic therapy. Discussed at length with Mr Ricky Ray that concern for clinical presentation he needs to be seen prior to prescribing antibiotics as may require other interventions including diagnostic testing, lab work with clinical description. Mr. Ricky Ray endorses that he will go to the Urgent Care where he has been seeing for this before. Emotional support provided.  Total time spent 20 minutes  Documentation 5 minutes  Phone discussion 15 minutes

## 2019-04-29 NOTE — Progress Notes (Signed)
Virtual Visit via Telephone Note    Evaluation Performed:  Follow-up visit  This visit type was conducted due to national recommendations for restrictions regarding the COVID-19 Pandemic (e.g. social distancing).  This format is felt to be most appropriate for this patient at this time.  All issues noted in this document were discussed and addressed.  No physical exam was performed (except for noted visual exam findings with Video Visits).  Please refer to the patient's chart (MyChart message for video visits and phone note for telephone visits) for the patient's consent to telehealth for [facility]  Date:  04/29/2019   ID:  Ricky Ray, DOB 1961/07/05, MRN [id number]  Patient Location:  [place] [address]   Provider location:   [facility] [place] [address], [place] [address]  PCP:  [facility], [place]  Cardiologist:  [place] Electrophysiologist:  None   Chief Complaint:  fatigue  History of Present Illness:    Ricky Ray is a 58 y.o. male who presents via audio/video conferencing for a telehealth visit today.  Patient verified DOB and address.  The patient does not have symptoms concerning for COVID-19 infection (fever, chills, cough, or new SHORTNESS OF BREATH).   Patient reports moderate fatigue upon minimal exertion. He says that he's "tired all the time". He reports difficulty sleeping and decreased appetite due to possible abscess in his mouth. He says that his mouth is sore and hurts to swallow anything. Denies any dizziness, swelling in his legs/ abdomen, chest pain, shortness of breath or weight gain. Says that he went to the [place] for a PCP appointment recently and "they didn't do anything for me, just talk".   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  Past Medical History:  Diagnosis Date  . DVT (deep venous thrombosis) ([place])   . Gastric ulcer   . History of ITP    Past Surgical History:  Procedure Laterality Date  . gastric ulcer    . TEE WITHOUT CARDIOVERSION N/A 02/17/2019   Procedure: TRANSESOPHAGEAL ECHOCARDIOGRAM (TEE);  Surgeon: Corey Skains, MD;  Location: [facility] ORS;  Service: Cardiovascular;  Laterality: N/A;     Current Meds  Medication Sig  . apixaban (ELIQUIS) 5 MG TABS tablet Take 1 tablet (5 mg total) by mouth 2 (two) times daily.  Marland Kitchen aspirin EC 81 MG EC tablet Take 1 tablet (81 mg total) by mouth daily.  . budesonide-formoterol (SYMBICORT) 160-4.5 MCG/ACT inhaler Inhale 2 puffs into the lungs 2 (two) times daily.  Marland Kitchen diltiazem (CARDIZEM SR) 120 MG 12 hr capsule Take 120 mg by mouth as needed. For heart rate >120   . furosemide (LASIX) 20 MG tablet Take 3 tablets (60 mg total) by mouth 2 (two) times daily. (Patient taking differently: Take 40 mg by mouth 2 (two) times daily. 2 tablets AM/ 1 tablet PM)  . ipratropium (ATROVENT) 0.02 % nebulizer solution Take 2.5 mLs (0.5 mg total) by nebulization every 6 (six) hours as needed for wheezing or shortness of breath.  . levalbuterol (XOPENEX) 0.63 MG/3ML nebulizer solution Take 3 mLs (0.63 mg total) by nebulization every 6 (six) hours as needed for wheezing or shortness of breath.  . metolazone (ZAROXOLYN) 2.5 MG tablet Take 1 tablet (2.5 mg total)  by mouth every Monday, Wednesday, and Friday. (Patient taking differently: Take 2.5 mg by mouth as needed. For fluid)  . midodrine (PROAMATINE) 10 MG tablet Take 1 tablet (10 mg total) by mouth 3 (three) times daily with meals.  . potassium chloride SA (K-DUR,KLOR-CON) 20 MEQ tablet Take 2 tablets (40 mEq total) by mouth 2 (two) times daily. (Patient taking differently: Take 20 mEq by mouth 2 (two) times daily. )  . tamsulosin (FLOMAX) 0.4 MG CAPS capsule Take 1 capsule (0.4 mg total) by mouth daily.     Allergies:    Patient has no known allergies.   Social History   Tobacco Use  . Smoking status: Current Every Day Smoker  . Smokeless tobacco: Never Used  Substance Use Topics  . Alcohol use: Yes  . Drug use: Not on file     Family Hx: The patient's family history includes CAD in his father.  ROS:   Please see the history of present illness.     All other systems reviewed and are negative.   Labs/Other Tests and Data Reviewed:    Recent Labs: 02/15/2019: ALT 10; B Natriuretic Peptide 3,586.0 02/24/2019: Magnesium 1.9 02/25/2019: BUN 13; Creatinine, Ser 0.82; Hemoglobin 13.9; Platelets 217; Potassium 4.4; Sodium 136   Recent Lipid Panel No results found for: CHOL, TRIG, HDL, CHOLHDL, LDLCALC, LDLDIRECT  Wt Readings from Last 3 Encounters:  04/29/19 138 lb (62.6 kg)  04/24/19 136 lb (61.7 kg)  04/01/19 136 lb (61.7 kg)     Exam:    Vital Signs:  Wt 138 lb (62.6 kg) Comment: self-reported  BMI 20.98 kg/m    Well nourished, well developed male in no  acute distress.   ASSESSMENT & PLAN:    1.Chronic heart failure with reduced ejection fraction-  - NYHA class III - euvolemic per patient's description of symptoms - weighing daily (although doesn't have his weight today) and says that he continues to lose weight;  Reminded to call for an overnight weight gain of >2 pounds or a weekly weight gain of >5 pounds - not adding salt to his food and is trying to closely follow a low sodium diet. 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  The importance of social distancing was discussed today.  Patient Risk:   After full review of this patients clinical status, I feel that they are at least moderate risk at this time.  Time:   Today, I have spent 8 minutes with the patient with telehealth technology discussing medications and symptoms to report.      Medication Adjustments/Labs and Tests Ordered:  Current medicines are reviewed at length with the patient today.  Concerns regarding medicines are outlined above. Patient says that his medications "haven't changed" and that he just woke up so doesn't want to get his medications.   Tests Ordered: No orders of the defined types were placed in this encounter.  Medication Changes: No orders of the defined types were placed in this encounter.   Disposition:  Follow-up in 2 months or sooner for any questions/problems before then  Signed, Alisa Graff, FNP  04/29/2019 10:27 AM    [facility]

## 2019-05-07 ENCOUNTER — Telehealth (HOSPITAL_COMMUNITY): Payer: Self-pay

## 2019-05-07 NOTE — Telephone Encounter (Signed)
142 lbs today.  He states he has gained some weight but has no swelling, he has a nurse coming out and measuring his legs.  He states been doing ok, he just finished antibiotics for abcess tooth.  He is trying to find a dental clinic, he does not have dental on his [employer] benefits and can not afford a dentist.  He has cardiology appt with [place] [facility] on Friday and advised him to ask them if they know of any clinic in [place].  He denies chest pain, shortness of breath, headaches or dizziness.  His sister does his medications and spoke with her this am and she advised he has them all and verified them.  He does not watch what he eats, he denies heart race racing.  He was fixing [place], gravy and biscuits for supper.  He drinks sodas most of the day.  He states he will eat what he wants to.  Will continue to discuss food with him.  He is wanting the cardiologist to stop some of his medications, he does not like taking medications.  Will continue to visit for heart failure.   [place] [phone]

## 2019-05-27 ENCOUNTER — Other Ambulatory Visit: Payer: Self-pay

## 2019-05-27 ENCOUNTER — Other Ambulatory Visit: Payer: Non-veteran care | Admitting: Nurse Practitioner

## 2019-05-27 ENCOUNTER — Encounter: Payer: Self-pay | Admitting: Nurse Practitioner

## 2019-05-27 DIAGNOSIS — R531 Weakness: Secondary | ICD-10-CM

## 2019-05-27 DIAGNOSIS — Z515 Encounter for palliative care: Secondary | ICD-10-CM

## 2019-05-27 DIAGNOSIS — R0602 Shortness of breath: Secondary | ICD-10-CM

## 2019-05-27 NOTE — Progress Notes (Signed)
[facility] Consult Note Telephone: [phone]  Fax: [phone]  PATIENT NAME: Ricky Ray DOB: Apr 04, 1961 MRN: [id number]  PRIMARY CARE PROVIDER:   [facility] [address] [place],  [place] [address]  RESPONSIBLE PARTY:   Self; Ricky Ray sister  Due to the COVID-19 crisis, this visit was done via telemedicine/telephone as video not available from my office and it was initiated and consent by this patient and or family.  RECOMMENDATIONS and PLAN: 1.Palliative care encounter Z51.5; Palliative medicine team will continue to support patient, patient's family, and medical team. Visit consisted of counseling and education dealing with the complex and emotionally intense issues of symptom management and palliative care in the setting of serious and potentially life-threatening illness  2.Dyspneic R06.00 secondary toCHF (EF 20%) ; continue to encourage O2,Continue daily weights  3.Generalized weaknessR53.1 secondary to CHFcontinueto encourage to wear O2;Encourage energy conservation and rest times.  ASSESSMENT:  Palliative care follow-up visit, I called Ricky Ray and he was in verbal agreement. We talked about how he was feeling today and he replies that he is doing okay. We talked about symptoms of team which he currently denies. We talked about his tooth and that he was in the process of trying to locate a dentist. He shared that his tooth is not bothering him anymore after antibiotics. He does verbalize that the roof of his mouth at times intermittently stay swollen. We talked about shortness of breath and he shared that that has improved. He talked about noticing worsening fatigue with exertion and then he becomes more short of breath. He replies that he went outside to trim some [place] and became short of breath, had to come in and rest. We talked about progression of cm.  We talked about his upcoming appointment tomorrow at the [facility] for a muga scan. He talked about running out of his amiodarone. He shared that his sister called Cardiology and was able to get that straight. Ricky Ray endorses that when he was out of his amiodarone for a few days he did feel tachycardia and more shortness of breath. He sure that he continues to work and knows when his heart rates up. He continues to have home health and shared that they do come out weekly and measure his legs. Ricky Ray endorses the edema has improved. We talked about his appetite and he shared that he is eating what he wants. He endorses that when it's hot he doesn't want to eat. We talked about the importance of eating and food choices. We talked about medical goals of care aggressive versus conservative versus comfort care. We talked about the most form and he shared that he did not see that. He was in agreement to further discuss with Ricky Ray sister and will complete with next follow-up visit once he's able to review it. Ricky Ray in agreement. We talked about the importance of medication compliance and he verbalized understanding Ricky Ray endorses he continues to want to be a full code with aggressive interventions. We talked about role of palliative care and plan of care. He does continue to be symptomatic with exertion though recovery with rest. We'll continue to follow a monitor with palliative care. Will follow up after muga scan to see if EF has improved. Schedule follow-up palliative care appointment set for July 30th at 2, Thursday. Therapeutic listening and emotional support provided. Contact information provided. Questions answered to satisfaction.  I spent 30 minutes providing this consultation,  from 3:00pm to 3:30pm. More than 50% of the time in this consultation was spent coordinating communication.   HISTORY OF PRESENT ILLNESS:  Ricky Ray is a 58 y.o. 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 Seen by the community Paramedic 6 / 10 / 2020, weight 124lbs. Has gained some weight but no swelling. Nurse is coming out measuring legs, completed antibiotic. He's trying to find a dental clinic as he does not have VA benefits for dental. He is not able to afford a dentist. He drink sodas most of the day and does not watch what he eats. He does not like taking medications and would like some of his medicine stopped by Cardiology as he has enough coming appointment. He continues to live at home independently and be followed at the [facility]. Palliative Care was asked to help to continue to address goals of care.   CODE STATUS: Full code  PPS: 50% HOSPICE ELIGIBILITY/DIAGNOSIS: TBD  PAST MEDICAL HISTORY:  Past Medical History:  Diagnosis Date  . DVT (deep venous thrombosis) ([place])   . Gastric ulcer   . History of ITP     SOCIAL HX:  Social History   Tobacco Use  . Smoking status: Current Every Day Smoker  . Smokeless tobacco: Never Used  Substance Use Topics  . Alcohol use: Yes    ALLERGIES: No Known Allergies   PERTINENT MEDICATIONS:  Outpatient Encounter Medications as of 05/27/2019  Medication Sig  . apixaban (ELIQUIS) 5 MG TABS tablet Take 1 tablet (5 mg total) by mouth 2 (two) times daily.  Marland Kitchen aspirin EC 81 MG EC tablet Take 1 tablet (81 mg total) by mouth daily.  . budesonide-formoterol (SYMBICORT) 160-4.5 MCG/ACT inhaler Inhale 2 puffs into the lungs 2 (two) times daily.  Marland Kitchen diltiazem (CARDIZEM SR) 120 MG 12 hr capsule Take 120 mg by mouth as needed. For heart rate >120   . furosemide (LASIX) 20 MG tablet Take 3 tablets (60 mg total) by mouth 2 (two) times daily. (Patient taking differently: Take 40 mg by mouth 2 (two) times daily. 2 tablets AM/ 1 tablet PM)  . ipratropium (ATROVENT) 0.02 % nebulizer solution Take 2.5 mLs (0.5 mg total) by nebulization every 6 (six) hours as  needed for wheezing or shortness of breath.  . levalbuterol (XOPENEX) 0.63 MG/3ML nebulizer solution Take 3 mLs (0.63 mg total) by nebulization every 6 (six) hours as needed for wheezing or shortness of breath.  . metolazone (ZAROXOLYN) 2.5 MG tablet Take 1 tablet (2.5 mg total) by mouth every Monday, Wednesday, and Friday. (Patient taking differently: Take 2.5 mg by mouth as needed. For fluid)  . midodrine (PROAMATINE) 10 MG tablet Take 1 tablet (10 mg total) by mouth 3 (three) times daily with meals.  . potassium chloride SA (K-DUR,KLOR-CON) 20 MEQ tablet Take 2 tablets (40 mEq total) by mouth 2 (two) times daily. (Patient taking differently: Take 20 mEq by mouth 2 (two) times daily. )  . tamsulosin (FLOMAX) 0.4 MG CAPS capsule Take 1 capsule (0.4 mg total) by mouth daily.   No facility-administered encounter medications on file as of 05/27/2019.     PHYSICAL EXAM:  Deferred  Keanna Tugwell Z Crystallee Werden, NP

## 2019-05-28 ENCOUNTER — Other Ambulatory Visit: Payer: Self-pay

## 2019-06-11 ENCOUNTER — Other Ambulatory Visit (HOSPITAL_COMMUNITY): Payer: Self-pay

## 2019-06-11 NOTE — Progress Notes (Signed)
Today had a visit with Ricky Ray.  He states just spoke with Cardiologist and advised him he was doing good, heart is right below normal.  He states he told him he could stop all medications except for his heart, they are all mostly for his heart.  His sister does his medicines and she is unavailable right now, asked him to have her call me.  He denies chest pain, palpatations, dizziness, headaches or shortness of breath.  He states he gain 5 lbs overnight due to what he ate yesterday, he is watching it closer today.  He did not tell the cardiologist about the weight gain.  He denies swelling in extremities or abdomen feeling tight.  Speaking in full sentences. Meds were verified and he states off oxygen and does not take anything for his breathing now.  Will continue to visit for heart failure, diet and medication compliance.   [place] [phone]

## 2019-06-17 ENCOUNTER — Telehealth: Payer: Self-pay | Admitting: Nurse Practitioner

## 2019-06-17 NOTE — Telephone Encounter (Signed)
Ricky Ray called to cancel palliative care appointments and follow-up appointments. He verbalize that he wanted to be withdrawn from palliative care. He talked about insurance billing. Explain that will forward his concerns to [facility] billing department and will follow up with the billing company in addition to him with updated information. Ricky Ray in agreement.  Total time spent 15 minutes  Documentation 5 minutes  Phone discussion 10 minutes

## 2019-07-01 ENCOUNTER — Ambulatory Visit: Payer: Non-veteran care | Admitting: Family

## 2019-07-04 ENCOUNTER — Ambulatory Visit: Payer: Non-veteran care | Admitting: Family

## 2019-07-24 ENCOUNTER — Other Ambulatory Visit (HOSPITAL_COMMUNITY): Payer: Self-pay

## 2019-07-24 NOTE — Progress Notes (Signed)
Today was a telephone visit, mostly talked with sister due to med changes and she handles his medications. Taking low dose of lisinopril, not sure of dose. Meds were verified.  He has come off a lot of his medications per his heart doctor at [place].  He states feels better but still gets tired during the day.  His sister states worst toward end of month when he is not drinking alcohol as much because he is running out of money.  He does not do a lot of physical activity.  Mostly stays in doors.  Does not watch diet, have discussed with him but he will not change his diet.  Fried foods, meet, potatoes and bread eater.  His sister care for him, she lives next door.  She states pulse has been staying in the 80's and BP has been right around 110's.  His weight has been the same, no lost or gain. They are aware to take lasix if weight gain or swelling.  Will continue to visit for heart failure.   Nile Dear [place] [id number]

## 2019-08-11 ENCOUNTER — Telehealth (HOSPITAL_COMMUNITY): Payer: Self-pay

## 2019-08-11 NOTE — Telephone Encounter (Signed)
Today Ricky Ray contacted me and advised he has an abcess tooth with swelling in his jaw and can not eat.  Advised him he needs to go to a dentist, he states can not afford one.  Advised if the [facility] could help him out, he advised they can not.  Gave him number for walk in clinic and advised he could go to ED for antibiotics.  He states he will go to [facility] and will try the walk in clinic.  We ahd discussed a dentist before, but with COVID the walkin clinic was not open.    [place] [phone]

## 2019-08-19 ENCOUNTER — Telehealth (HOSPITAL_COMMUNITY): Payer: Self-pay

## 2019-08-19 NOTE — Telephone Encounter (Signed)
Today contacted Ricky Ray and he advised he is doing good. He has been doing well with his heart failure and his doctor has took him off a lot of his medications. He is aware of his medications and what to take.  He is followed at [facility] in [facility] at Cardiology.  He has had good reports from his doctors.  Advised him I will be discharging him from my program but to keep my number and if he has any problems he can call.  Advised him he can be added back to the program if there is a need.  He appears to understand.  Darylene Price with HF clinic is aware and agree.    [name] [phone]

## 2019-09-03 ENCOUNTER — Ambulatory Visit: Payer: Non-veteran care | Admitting: Family

## 2019-12-18 ENCOUNTER — Telehealth: Payer: Self-pay

## 2019-12-18 NOTE — Telephone Encounter (Signed)
RN contacted patient to make him aware that palliative care has not been able to receive VA approval to pay for palliative outpatient visits.  Pt discloses that he is doing well and is keeping appts with MDs.  Will discharge patient from the palliative care team

## 2020-02-07 IMAGING — CR CHEST - 2 VIEW
2 series · 2 of 2 positions shown · non-contrast
Comparison: Chest x-ray dated 07/06/2014.

CLINICAL DATA: Increased edema starting 3 weeks ago. Shortness of
breath starting 3 days ago.

EXAM:
CHEST - 2 VIEW

[chest lat]
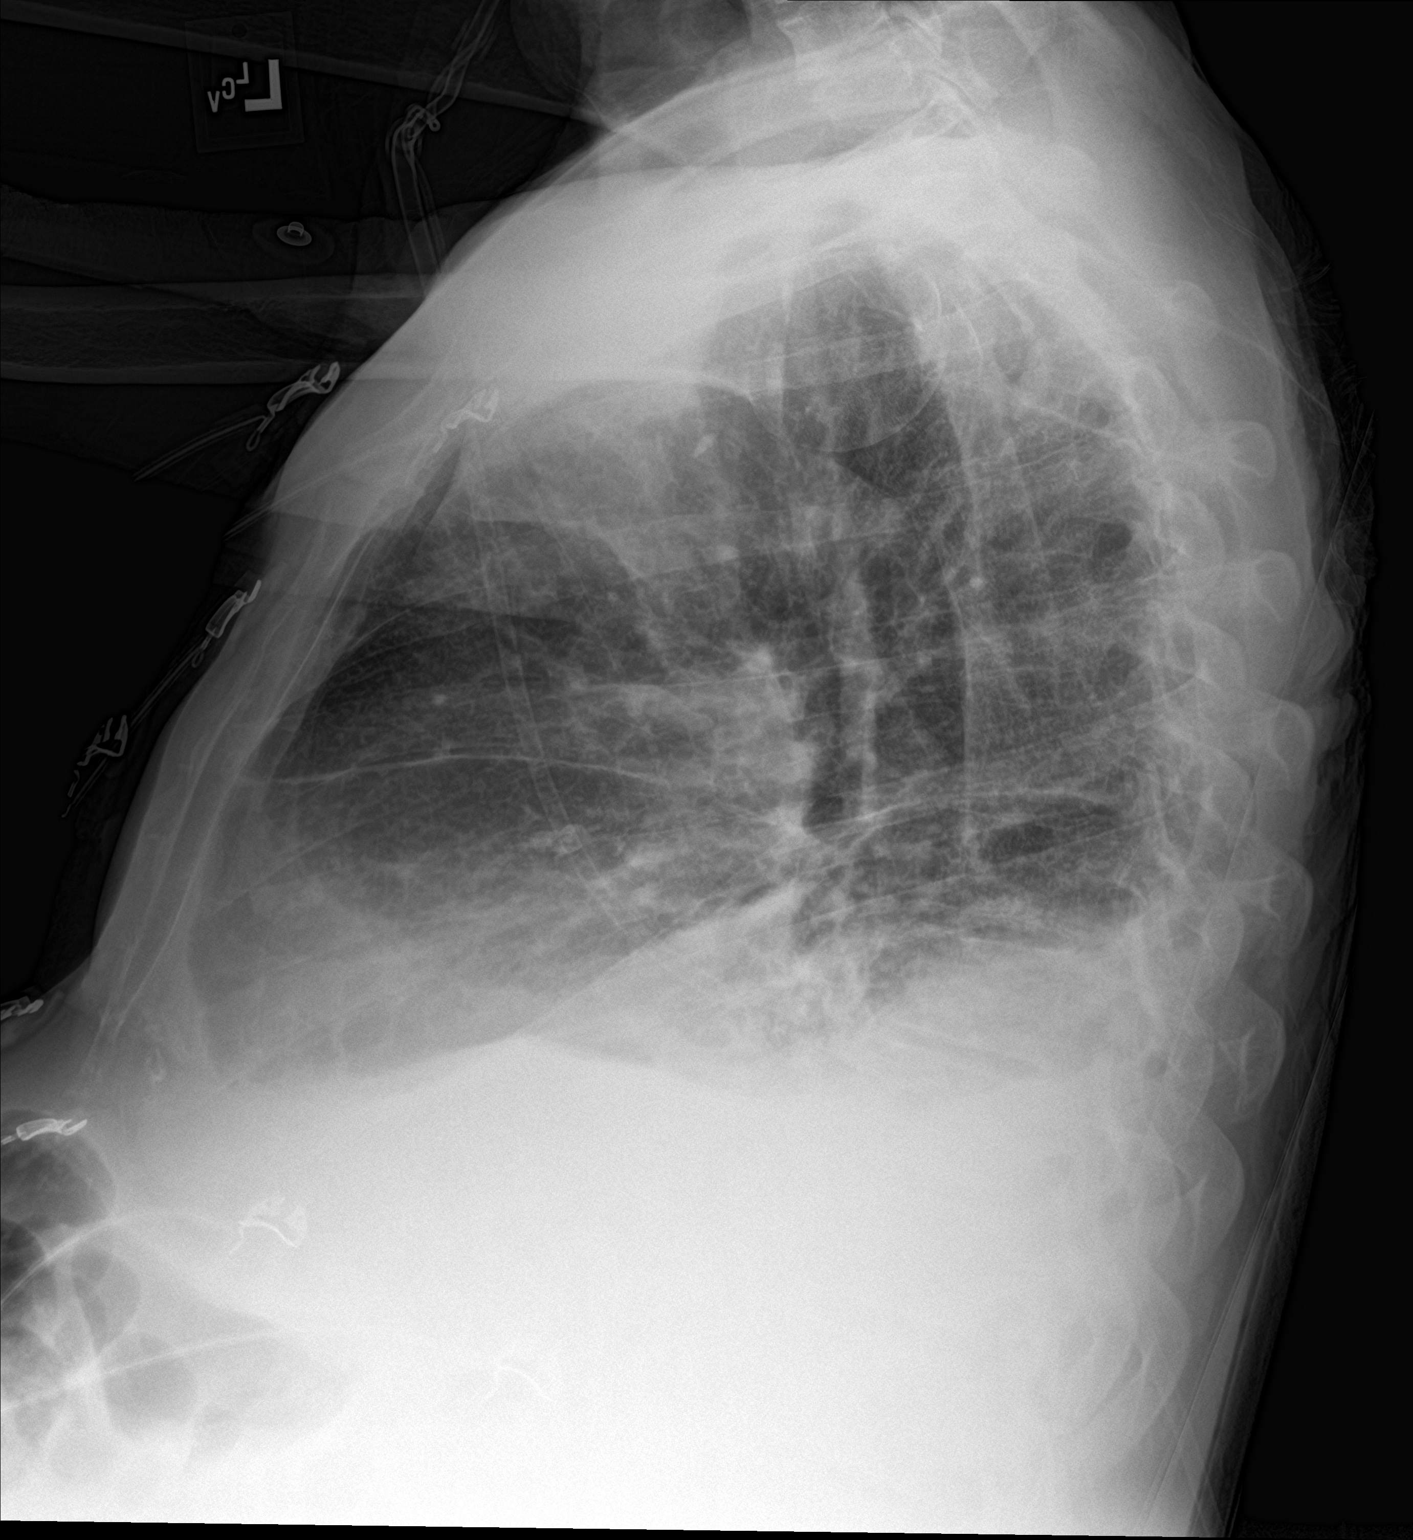

[chest ap]
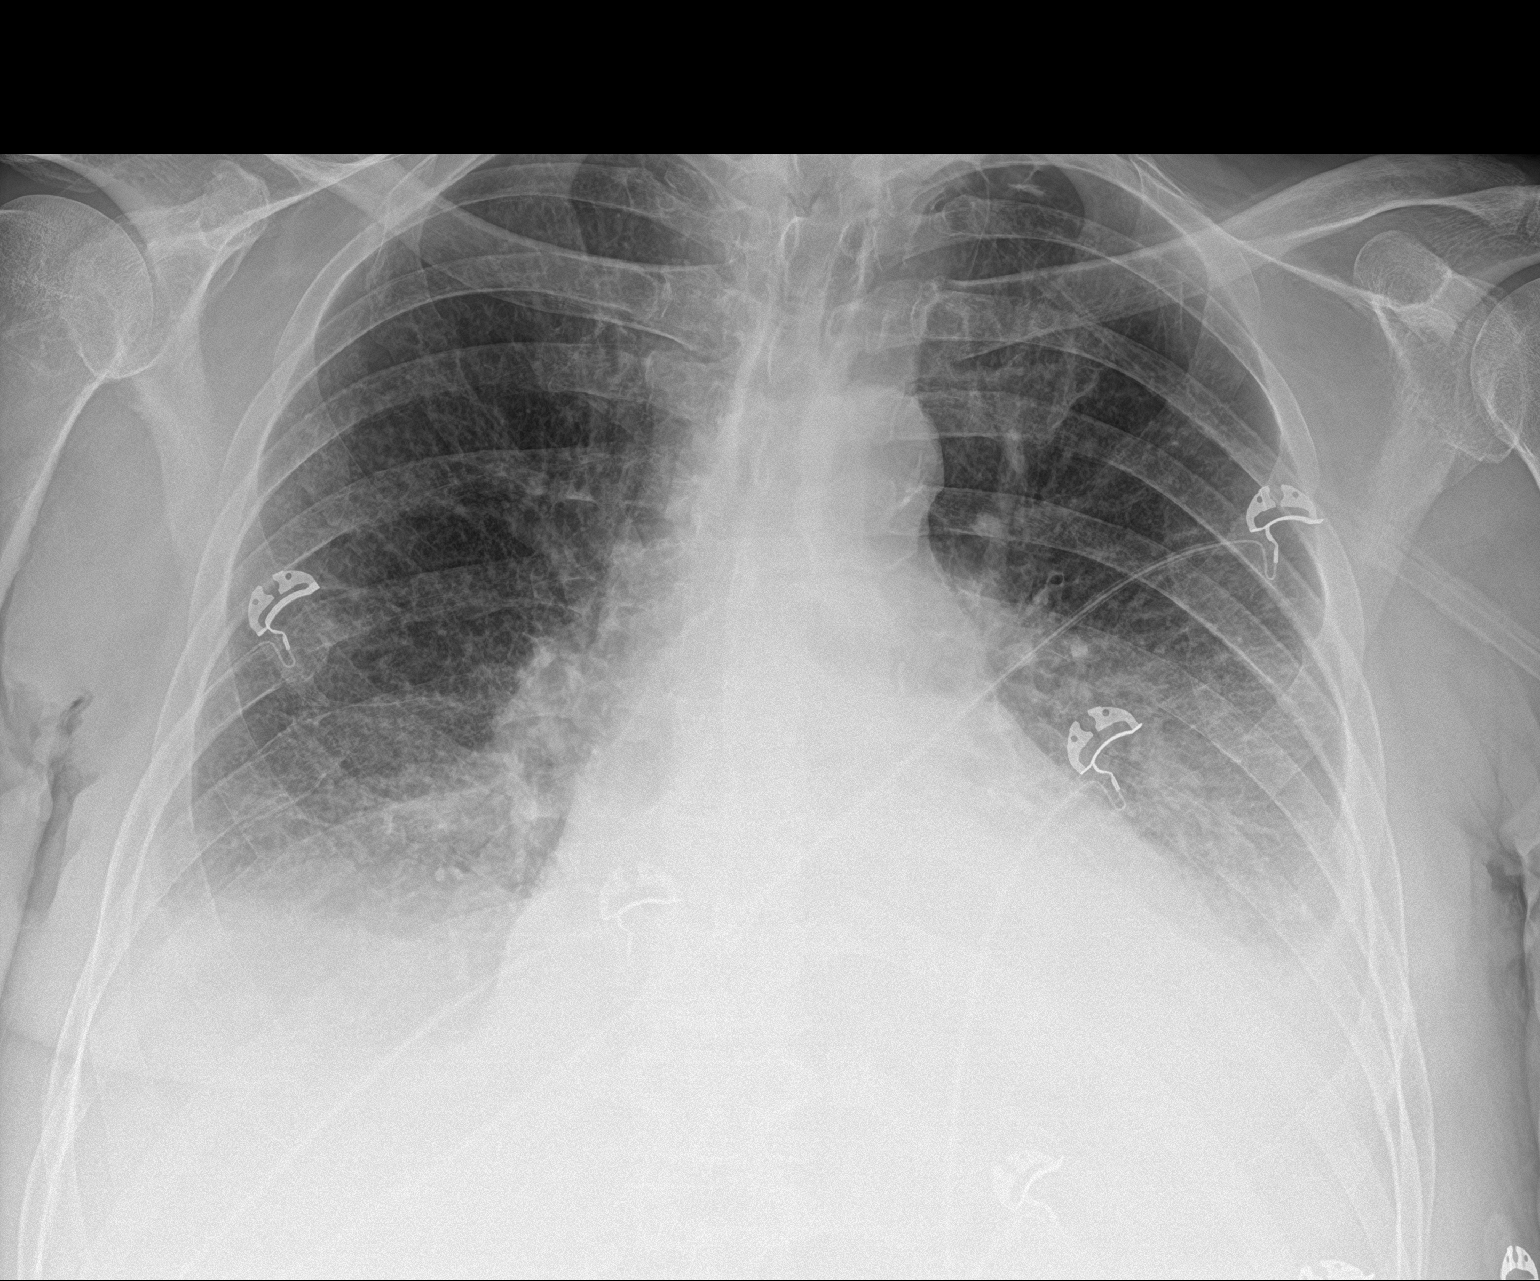

[2 of 2 positions shown; findings below may reference images not displayed]

FINDINGS: Mild cardiomegaly. Central pulmonary vascular congestion and
bibasilar opacities. Probable small bilateral pleural effusions. No
pneumothorax seen. No acute appearing osseous abnormality.
IMPRESSION: 1. Cardiomegaly with central pulmonary vascular congestion
suggesting mild CHF/volume overload.
2. Bibasilar opacities are likely a combination of atelectasis and
small pleural effusions.
